# Patient Record
Sex: Male | Born: 1950 | Marital: Married | Smoking: Former smoker
Health system: Southern US, Community
[De-identification: ages and names within clinical notes are randomized; demographics above are authoritative.]

## PROBLEM LIST (undated history)

## (undated) DIAGNOSIS — J449 Chronic obstructive pulmonary disease, unspecified: Secondary | ICD-10-CM

## (undated) DIAGNOSIS — E119 Type 2 diabetes mellitus without complications: Secondary | ICD-10-CM

## (undated) DIAGNOSIS — E785 Hyperlipidemia, unspecified: Secondary | ICD-10-CM

## (undated) DIAGNOSIS — M1990 Unspecified osteoarthritis, unspecified site: Secondary | ICD-10-CM

## (undated) DIAGNOSIS — N200 Calculus of kidney: Secondary | ICD-10-CM

## (undated) DIAGNOSIS — K219 Gastro-esophageal reflux disease without esophagitis: Secondary | ICD-10-CM

## (undated) HISTORY — PX: COLOSTOMY: SHX63

## (undated) HISTORY — PX: NECK SURGERY: SHX720

## (undated) HISTORY — DX: Chronic obstructive pulmonary disease, unspecified: J44.9

## (undated) HISTORY — PX: ELBOW BURSA SURGERY: SHX615

## (undated) HISTORY — DX: Hyperlipidemia, unspecified: E78.5

## (undated) HISTORY — DX: Gastro-esophageal reflux disease without esophagitis: K21.9

## (undated) HISTORY — DX: Type 2 diabetes mellitus without complications: E11.9

## (undated) HISTORY — PX: APPENDECTOMY: SHX54

---

## 2015-04-13 ENCOUNTER — Encounter: Payer: Self-pay | Admitting: Gastroenterology

## 2015-05-03 ENCOUNTER — Encounter: Payer: Self-pay | Admitting: Gastroenterology

## 2015-05-03 ENCOUNTER — Other Ambulatory Visit: Payer: Self-pay

## 2015-05-03 ENCOUNTER — Ambulatory Visit (INDEPENDENT_AMBULATORY_CARE_PROVIDER_SITE_OTHER): Payer: Medicare HMO | Admitting: Gastroenterology

## 2015-05-03 VITALS — BP 115/74 | HR 89 | Temp 97.8°F | Ht 68.0 in | Wt 194.8 lb

## 2015-05-03 DIAGNOSIS — R110 Nausea: Secondary | ICD-10-CM

## 2015-05-03 DIAGNOSIS — R1013 Epigastric pain: Secondary | ICD-10-CM

## 2015-05-03 DIAGNOSIS — Z86010 Personal history of colonic polyps: Secondary | ICD-10-CM

## 2015-05-03 DIAGNOSIS — K635 Polyp of colon: Secondary | ICD-10-CM | POA: Diagnosis not present

## 2015-05-03 MED ORDER — NA SULFATE-K SULFATE-MG SULF 17.5-3.13-1.6 GM/177ML PO SOLN: 1.0000 | ORAL | Status: DC

## 2015-05-03 NOTE — Progress Notes (Signed)
Subjective:    Patient ID: Samuel Bird, male    DOB: November 14, 1950, 65 y.o.   MRN: [id number]  Shade Flood, MD  HPI Heartburn FAIRLY WELL CONTROLLED ON PRILOSEC 40 MG ONCE A DAY FOR PAST YEARS. GET SLOTS OF "CHILLS". NAUSEA: OFF AND ON-2-3 TIMES A WEEK. LASTS FOR FEW MINS. BETTER WITH TIME. BMs: SOFT(1X/DAY IN THE AM). HAS BREATHING PROBLEMS. HAS COPD. USED TO SMOKE BUT QUIT 7 YRS. ORIGINALLY FROM [place]. FEELS BLOATED AND RARE ABDOMINAL PAIN. APPETITE: NOT THE BEST. WEIGHT: SAME   PT DENIES FEVER, CHILLS, HEMATOCHEZIA, HEMATEMESIS, vomiting, melena, diarrhea, CHEST PAIN, SHORTNESS OF BREATH,  CHANGE IN BOWEL IN HABITS, constipation, abdominal pain, problems swallowing, OR problems with sedation.  Past Medical History  Diagnosis Date  . Diabetes mellitus ([place])   . COPD (chronic obstructive pulmonary disease) ([place])   . Hyperlipidemia   . GERD (gastroesophageal reflux disease)     Past Surgical History  Procedure Laterality Date  . Colostomy    . Appendectomy    . Neck surgery      Allergies  Allergen Reactions  . Glipizide Nausea And Vomiting  . Naproxen Other (See Comments)    "messing with his kidneys"    Current Outpatient Prescriptions  Medication Sig Dispense Refill  . acetaminophen (TYLENOL) 500 MG tablet Take by mouth daily as needed.    Marland Kitchen albuterol (VENTOLIN HFA) 108 (90 Base) MCG/ACT inhaler Inhale 2 puffs into the lungs as needed.    . carisoprodol (SOMA) 350 MG tablet 350 mg daily as needed.    . cycloSPORINE (RESTASIS) 0.05 % ophthalmic emulsion Apply to eye 2 (two) times daily.    . fluticasone (FLONASE) 50 MCG/ACT nasal spray Place into the nose.    . gabapentin (NEURONTIN) 300 MG capsule 300 mg 2 (two) times daily.    . metFORMIN (GLUCOPHAGE) 1000 MG tablet 1,000 mg 2 (two) times daily.    Marland Kitchen omeprazole (PRILOSEC) 40 MG capsule Take 40 mg by mouth daily.    . simvastatin (ZOCOR) 40 MG tablet Take 40 mg by mouth daily.    . sitaGLIPtin (JANUVIA) 50 MG  tablet Take 50 mg by mouth daily.    . Tiotropium Bromide Monohydrate (SPIRIVA RESPIMAT) 2.5 MCG/ACT AERS Inhale into the lungs.     Family History  Problem Relation Age of Onset  . Colon cancer Neg Hx   . Colon polyps Neg Hx    Social History  Substance Use Topics  . Smoking status: Former Research scientist (life sciences)  . Smokeless tobacco: None  . Alcohol Use: None   Review of Systems PER HPI OTHERWISE ALL SYSTEMS ARE NEGATIVE.    Objective:   Physical Exam  Constitutional: He is oriented to person, place, and time. He appears well-developed and well-nourished. No distress.  HENT:  Head: Normocephalic and atraumatic.  Mouth/Throat: Oropharynx is clear and moist. No oropharyngeal exudate.  Eyes: Pupils are equal, round, and reactive to light. No scleral icterus.  Neck: Normal range of motion. Neck supple.  Cardiovascular: Normal rate, regular rhythm and normal heart sounds.   Pulmonary/Chest: Effort normal and breath sounds normal. No respiratory distress.  Abdominal: Soft. Bowel sounds are normal. He exhibits no distension. There is no tenderness.  Musculoskeletal: He exhibits no edema.  Lymphadenopathy:    He has no cervical adenopathy.  Neurological: He is alert and oriented to person, place, and time.  NO FOCAL DEFICITS  Psychiatric: He has a normal mood and affect.  Vitals reviewed.  Assessment & Plan:

## 2015-05-03 NOTE — Progress Notes (Signed)
cc'ed to pcp °

## 2015-05-03 NOTE — Patient Instructions (Addendum)
COMPLETE ENDOSCOPY.  HOLD JANUVIA ON MORNING OF COLONOSCOPY.  CONTINUE GLUCOPHAGE ON MORNING OF COLONOSCOPY.

## 2015-05-03 NOTE — Assessment & Plan Note (Signed)
PERSONAL HISTORY O F POLYPS. LAST TCS 2009.  TCS WITHIN NEXT MO. DISCUSSED PROCEDURE, BENEFITS, & RISKS: < 1% chance of medication reaction, bleeding, perforation, or rupture of spleen/liver. OBTAIN LAST TCS/PATH REPORT FROM DR. PANDYA. HOLD JANUVIA ON AM OF TCS. CONTINUE GLUCOPHAGE

## 2015-05-03 NOTE — Assessment & Plan Note (Signed)
ASSOCIATED WITH NAUSEA & bloating. DIFFERENTIAL DIAGNOSIS INCLUDES: H PYLORI GASTRITIS, UNCONTROLLED GERD, LESS LIKELY GE JUNCTION TUMOR, GASTRIC OR PANCREATIC CA OR CHRONIC MESENTERIC ISCHEMIA.  EGD WITHIN  NEXT MO. DISCUSSED PROCEDURE, BENEFITS, & RISKS: < 1% chance of medication reaction, PERFORATION, OR bleeding. CONTINUE OMEPRAZOLE.  TAKE 30 MINUTES PRIOR TO YOUR FIRST MEAL. [facility] TBS SCHEDULED AFTER ENDOSCOPY.

## 2015-05-31 ENCOUNTER — Encounter (HOSPITAL_COMMUNITY): Payer: Self-pay | Admitting: *Deleted

## 2015-05-31 ENCOUNTER — Ambulatory Visit (HOSPITAL_COMMUNITY)
Admission: RE | Admit: 2015-05-31 | Discharge: 2015-05-31 | Disposition: A | Discharge status: Home / Self Care | Payer: Medicare HMO | Source: Ambulatory Visit | Attending: Gastroenterology | Admitting: Gastroenterology

## 2015-05-31 ENCOUNTER — Encounter (HOSPITAL_COMMUNITY)
Admission: RE | Disposition: A | Discharge status: Home / Self Care | Payer: Self-pay | Source: Ambulatory Visit | Attending: Gastroenterology

## 2015-05-31 DIAGNOSIS — K644 Residual hemorrhoidal skin tags: Secondary | ICD-10-CM | POA: Diagnosis not present

## 2015-05-31 DIAGNOSIS — R110 Nausea: Secondary | ICD-10-CM

## 2015-05-31 DIAGNOSIS — J449 Chronic obstructive pulmonary disease, unspecified: Secondary | ICD-10-CM | POA: Insufficient documentation

## 2015-05-31 DIAGNOSIS — E119 Type 2 diabetes mellitus without complications: Secondary | ICD-10-CM | POA: Diagnosis not present

## 2015-05-31 DIAGNOSIS — Z7984 Long term (current) use of oral hypoglycemic drugs: Secondary | ICD-10-CM | POA: Diagnosis not present

## 2015-05-31 DIAGNOSIS — K2950 Unspecified chronic gastritis without bleeding: Secondary | ICD-10-CM | POA: Insufficient documentation

## 2015-05-31 DIAGNOSIS — K5730 Diverticulosis of large intestine without perforation or abscess without bleeding: Secondary | ICD-10-CM | POA: Insufficient documentation

## 2015-05-31 DIAGNOSIS — K648 Other hemorrhoids: Secondary | ICD-10-CM | POA: Diagnosis not present

## 2015-05-31 DIAGNOSIS — Z79899 Other long term (current) drug therapy: Secondary | ICD-10-CM | POA: Insufficient documentation

## 2015-05-31 DIAGNOSIS — K2970 Gastritis, unspecified, without bleeding: Secondary | ICD-10-CM | POA: Diagnosis not present

## 2015-05-31 DIAGNOSIS — K635 Polyp of colon: Secondary | ICD-10-CM | POA: Diagnosis not present

## 2015-05-31 DIAGNOSIS — D122 Benign neoplasm of ascending colon: Secondary | ICD-10-CM | POA: Insufficient documentation

## 2015-05-31 DIAGNOSIS — E785 Hyperlipidemia, unspecified: Secondary | ICD-10-CM | POA: Diagnosis not present

## 2015-05-31 DIAGNOSIS — Z87891 Personal history of nicotine dependence: Secondary | ICD-10-CM | POA: Diagnosis not present

## 2015-05-31 DIAGNOSIS — Z860100 Personal history of colon polyps, unspecified: Secondary | ICD-10-CM

## 2015-05-31 DIAGNOSIS — K219 Gastro-esophageal reflux disease without esophagitis: Secondary | ICD-10-CM | POA: Insufficient documentation

## 2015-05-31 DIAGNOSIS — Z1211 Encounter for screening for malignant neoplasm of colon: Secondary | ICD-10-CM | POA: Diagnosis not present

## 2015-05-31 HISTORY — PX: ESOPHAGOGASTRODUODENOSCOPY: SHX5428

## 2015-05-31 HISTORY — PX: COLONOSCOPY: SHX5424

## 2015-05-31 LAB — GLUCOSE, CAPILLARY: Glucose-Capillary: 144 mg/dL — ABNORMAL HIGH (ref 65–99)

## 2015-05-31 SURGERY — COLONOSCOPY
Anesthesia: Moderate Sedation

## 2015-05-31 MED ORDER — MEPERIDINE HCL 100 MG/ML IJ SOLN
INTRAMUSCULAR | Status: AC
  Filled 2015-05-31: qty 2

## 2015-05-31 MED ORDER — LIDOCAINE VISCOUS 2 % MT SOLN
OROMUCOSAL | Status: DC | PRN
  Administered 2015-05-31: 5 mL via OROMUCOSAL

## 2015-05-31 MED ORDER — MEPERIDINE HCL 100 MG/ML IJ SOLN
INTRAMUSCULAR | Status: DC | PRN
  Administered 2015-05-31 (×4): 25 mg via INTRAVENOUS

## 2015-05-31 MED ORDER — LIDOCAINE VISCOUS 2 % MT SOLN
OROMUCOSAL | Status: AC
  Filled 2015-05-31: qty 15

## 2015-05-31 MED ORDER — MIDAZOLAM HCL 5 MG/5ML IJ SOLN
INTRAMUSCULAR | Status: DC | PRN
  Administered 2015-05-31 (×2): 2 mg via INTRAVENOUS
  Administered 2015-05-31: 1 mg via INTRAVENOUS
  Administered 2015-05-31: 2 mg via INTRAVENOUS

## 2015-05-31 MED ORDER — MIDAZOLAM HCL 5 MG/5ML IJ SOLN
INTRAMUSCULAR | Status: AC
  Filled 2015-05-31: qty 10

## 2015-05-31 MED ORDER — SODIUM CHLORIDE 0.9 % IV SOLN
INTRAVENOUS | Status: DC
  Administered 2015-05-31: 08:00:00 via INTRAVENOUS

## 2015-05-31 MED ORDER — STERILE WATER FOR IRRIGATION IR SOLN
Status: DC | PRN
  Administered 2015-05-31: 2.5 mL

## 2015-05-31 NOTE — Interval H&P Note (Signed)
History and Physical Interval Note:  05/31/2015 8:23 AM  Samuel Bird  has presented today for surgery, with the diagnosis of HISTORY OF POLYPS/NAUSEA  The various methods of treatment have been discussed with the patient and family. After consideration of risks, benefits and other options for treatment, the patient has consented to  Procedure(s) with comments: COLONOSCOPY (N/A) - 830 ESOPHAGOGASTRODUODENOSCOPY (EGD) (N/A) as a surgical intervention .  The patient's history has been reviewed, patient examined, no change in status, stable for surgery.  I have reviewed the patient's chart and labs.  Questions were answered to the patient's satisfaction.     [employer]

## 2015-05-31 NOTE — Discharge Instructions (Signed)
You had 1 polyp removed. YOU HAVE DIVERTICULOSIS IN YOUR LEFT and right COLON . YOU HAVE Large INTERNAL  And external HEMORRHOIDS. YOU HAVE GASTRITIS. I BIOPSIED YOUR STOMACH.    CONTINUE YOUR WEIGHT LOSS EFFORTS.  DRINK WATER TO KEEP YOUR URINE LIGHT YELLOW.  FOLLOW A HIGH FIBER/LOW FAT DIET. AVOID ITEMS THAT CAUSE BLOATING. SEE INFO BELOW.  CONTINUE OMEPRAZOLE.  TAKE 30 MINUTES PRIOR TO YOUR FIRST MEAL.  YOUR BIOPSY RESULTS WILL BE AVAILABLE IN MY CHART MAR 20 AND MY OFFICE WILL CONTACT YOU IN 10-14 DAYS WITH YOUR RESULTS.   Follow up in [place] 2017.  Next colonoscopy in 3-5 years.    ENDOSCOPY Care After Read the instructions outlined below and refer to this sheet in the next week. These discharge instructions provide you with general information on caring for yourself after you leave the hospital. While your treatment has been planned according to the most current medical practices available, unavoidable complications occasionally occur. If you have any problems or questions after discharge, call DR. Aden Youngman, [phone].  ACTIVITY  You may resume your regular activity, but move at a slower pace for the next 24 hours.   Take frequent rest periods for the next 24 hours.   Walking will help get rid of the air and reduce the bloated feeling in your belly (abdomen).   No driving for 24 hours (because of the medicine (anesthesia) used during the test).   You may shower.   Do not sign any important legal documents or operate any machinery for 24 hours (because of the anesthesia used during the test).    NUTRITION  Drink plenty of fluids.   You may resume your normal diet as instructed by your doctor.   Begin with a light meal and progress to your normal diet. Heavy or fried foods are harder to digest and may make you feel sick to your stomach (nauseated).   Avoid alcoholic beverages for 24 hours or as instructed.    MEDICATIONS  You may resume your normal  medications.   WHAT YOU CAN EXPECT TODAY  Some feelings of bloating in the abdomen.   Passage of more gas than usual.   Spotting of blood in your stool or on the toilet paper  .  IF YOU HAD POLYPS REMOVED DURING THE ENDOSCOPY:  Eat a soft diet IF YOU HAVE NAUSEA, BLOATING, ABDOMINAL PAIN, OR VOMITING.    FINDING OUT THE RESULTS OF YOUR TEST Not all test results are available during your visit. DR. Oneida Alar WILL CALL YOU WITHIN 14 DAYS OF YOUR PROCEDUE WITH YOUR RESULTS. Do not assume everything is normal if you have not heard from DR. Kayle Correa, CALL HER OFFICE AT [phone].  SEEK IMMEDIATE MEDICAL ATTENTION AND CALL THE OFFICE: [phone] IF:  You have more than a spotting of blood in your stool.   Your belly is swollen (abdominal distention).   You are nauseated or vomiting.   You have a temperature over 101F.   You have abdominal pain or discomfort that is severe or gets worse throughout the day.   Low-Fat Diet BREADS, CEREALS, PASTA, RICE, DRIED PEAS, AND BEANS These products are high in carbohydrates and most are low in fat. Therefore, they can be increased in the diet as substitutes for fatty foods. They too, however, contain calories and should not be eaten in excess. Cereals can be eaten for snacks as well as for breakfast.   FRUITS AND VEGETABLES It is good to eat fruits and vegetables. Besides  being sources of fiber, both are rich in vitamins and some minerals. They help you get the daily allowances of these nutrients. Fruits and vegetables can be used for snacks and desserts.  MEATS Limit lean meat, chicken, [place], and fish to no more than 6 ounces per day. Beef, Pork, and Lamb Use lean cuts of beef, pork, and lamb. Lean cuts include:  Extra-lean ground beef.  Arm roast.  Sirloin tip.  Center-cut ham.  Round steak.  Loin chops.  Rump roast.  Tenderloin.  Trim all fat off the outside of meats before cooking. It is not necessary to severely decrease the  intake of red meat, but lean choices should be made. Lean meat is rich in protein and contains a highly absorbable form of iron. Premenopausal women, in particular, should avoid reducing lean red meat because this could increase the risk for low red blood cells (iron-deficiency anemia).  Chicken and [place] These are good sources of protein. The fat of poultry can be reduced by removing the skin and underlying fat layers before cooking. Chicken and [place] can be substituted for lean red meat in the diet. Poultry should not be fried or covered with high-fat sauces. Fish and Shellfish Fish is a good source of protein. Shellfish contain cholesterol, but they usually are low in saturated fatty acids. The preparation of fish is important. Like chicken and [place], they should not be fried or covered with high-fat sauces. EGGS Egg whites contain no fat or cholesterol. They can be eaten often. Try 1 to 2 egg whites instead of whole eggs in recipes or use egg substitutes that do not contain yolk. MILK AND DAIRY PRODUCTS Use skim or 1% milk instead of 2% or whole milk. Decrease whole milk, natural, and processed cheeses. Use nonfat or low-fat (2%) cottage cheese or low-fat cheeses made from vegetable oils. Choose nonfat or low-fat (1 to 2%) yogurt. Experiment with evaporated skim milk in recipes that call for heavy cream. Substitute low-fat yogurt or low-fat cottage cheese for sour cream in dips and salad dressings. Have at least 2 servings of low-fat dairy products, such as 2 glasses of skim (or 1%) milk each day to help get your daily calcium intake. FATS AND OILS Reduce the total intake of fats, especially saturated fat. Butterfat, lard, and beef fats are high in saturated fat and cholesterol. These should be avoided as much as possible. Vegetable fats do not contain cholesterol, but certain vegetable fats, such as coconut oil, palm oil, and palm kernel oil are very high in saturated fats. These should be  limited. These fats are often used in bakery goods, processed foods, popcorn, oils, and nondairy creamers. Vegetable shortenings and some peanut butters contain hydrogenated oils, which are also saturated fats. Read the labels on these foods and check for saturated vegetable oils. Unsaturated vegetable oils and fats do not raise blood cholesterol. However, they should be limited because they are fats and are high in calories. Total fat should still be limited to 30% of your daily caloric intake. Desirable liquid vegetable oils are corn oil, cottonseed oil, olive oil, canola oil, safflower oil, soybean oil, and sunflower oil. Peanut oil is not as good, but small amounts are acceptable. Buy a heart-healthy tub margarine that has no partially hydrogenated oils in the ingredients. Mayonnaise and salad dressings often are made from unsaturated fats, but they should also be limited because of their high calorie and fat content. Seeds, nuts, peanut butter, olives, and avocados are high in fat,  but the fat is mainly the unsaturated type. These foods should be limited mainly to avoid excess calories and fat. OTHER EATING TIPS Snacks  Most sweets should be limited as snacks. They tend to be rich in calories and fats, and their caloric content outweighs their nutritional value. Some good choices in snacks are graham crackers, melba toast, soda crackers, bagels (no egg), English muffins, fruits, and vegetables. These snacks are preferable to snack crackers, [place] fries, TORTILLA CHIPS, and POTATO chips. Popcorn should be air-popped or cooked in small amounts of liquid vegetable oil. Desserts Eat fruit, low-fat yogurt, and fruit ices instead of pastries, cake, and cookies. Sherbet, angel food cake, gelatin dessert, frozen low-fat yogurt, or other frozen products that do not contain saturated fat (pure fruit juice bars, frozen ice pops) are also acceptable.  COOKING METHODS Choose those methods that use little or no  fat. They include: Poaching.  Braising.  Steaming.  Grilling.  Baking.  Stir-frying.  Broiling.  Microwaving.  Foods can be cooked in a nonstick pan without added fat, or use a nonfat cooking spray in regular cookware. Limit fried foods and avoid frying in saturated fat. Add moisture to lean meats by using water, broth, cooking wines, and other nonfat or low-fat sauces along with the cooking methods mentioned above. Soups and stews should be chilled after cooking. The fat that forms on top after a few hours in the refrigerator should be skimmed off. When preparing meals, avoid using excess salt. Salt can contribute to raising blood pressure in some people.  EATING AWAY FROM HOME Order entres, potatoes, and vegetables without sauces or butter. When meat exceeds the size of a deck of cards (3 to 4 ounces), the rest can be taken home for another meal. Choose vegetable or fruit salads and ask for low-calorie salad dressings to be served on the side. Use dressings sparingly. Limit high-fat toppings, such as bacon, crumbled eggs, cheese, sunflower seeds, and olives. Ask for heart-healthy tub margarine instead of butter.  High-Fiber Diet A high-fiber diet changes your normal diet to include more whole grains, legumes, fruits, and vegetables. Changes in the diet involve replacing refined carbohydrates with unrefined foods. The calorie level of the diet is essentially unchanged. The Dietary Reference Intake (recommended amount) for adult males is 38 grams per day. For adult females, it is 25 grams per day. Pregnant and lactating women should consume 28 grams of fiber per day. Fiber is the intact part of a plant that is not broken down during digestion. Functional fiber is fiber that has been isolated from the plant to provide a beneficial effect in the body. PURPOSE  Increase stool bulk.   Ease and regulate bowel movements.   Lower cholesterol.  REDUCE RISK OF COLON CANCER  INDICATIONS THAT YOU  NEED MORE FIBER  Constipation and hemorrhoids.   Uncomplicated diverticulosis (intestine condition) and irritable bowel syndrome.   Weight management.   As a protective measure against hardening of the arteries (atherosclerosis), diabetes, and cancer.   GUIDELINES FOR INCREASING FIBER IN THE DIET  Start adding fiber to the diet slowly. A gradual increase of about 5 more grams (2 slices of whole-wheat bread, 2 servings of most fruits or vegetables, or 1 bowl of high-fiber cereal) per day is best. Too rapid an increase in fiber may result in constipation, flatulence, and bloating.   Drink enough water and fluids to keep your urine clear or pale yellow. Water, juice, or caffeine-free drinks are recommended. Not drinking enough fluid  may cause constipation.   Eat a variety of high-fiber foods rather than one type of fiber.   Try to increase your intake of fiber through using high-fiber foods rather than fiber pills or supplements that contain small amounts of fiber.   The goal is to change the types of food eaten. Do not supplement your present diet with high-fiber foods, but replace foods in your present diet.   INCLUDE A VARIETY OF FIBER SOURCES  Replace refined and processed grains with whole grains, canned fruits with fresh fruits, and incorporate other fiber sources. White rice, white breads, and most bakery goods contain little or no fiber.   Brown whole-grain rice, buckwheat oats, and many fruits and vegetables are all good sources of fiber. These include: broccoli, Brussels sprouts, cabbage, cauliflower, beets, sweet potatoes, white potatoes (skin on), carrots, tomatoes, eggplant, squash, berries, fresh fruits, and dried fruits.   Cereals appear to be the richest source of fiber. Cereal fiber is found in whole grains and bran. Bran is the fiber-rich outer coat of cereal grain, which is largely removed in refining. In whole-grain cereals, the bran remains. In breakfast cereals, the  largest amount of fiber is found in those with "bran" in their names. The fiber content is sometimes indicated on the label.   You may need to include additional fruits and vegetables each day.   In baking, for 1 cup white flour, you may use the following substitutions:   1 cup whole-wheat flour minus 2 tablespoons.   1/2 cup white flour plus 1/2 cup whole-wheat flour.    Polyps, Colon  A polyp is extra tissue that grows inside your body. Colon polyps grow in the large intestine. The large intestine, also called the colon, is part of your digestive system. It is a long, hollow tube at the end of your digestive tract where your body makes and stores stool. Most polyps are not dangerous. They are benign. This means they are not cancerous. But over time, some types of polyps can turn into cancer. Polyps that are smaller than a pea are usually not harmful. But larger polyps could someday become or may already be cancerous. To be safe, doctors remove all polyps and test them.   PREVENTION There is not one sure way to prevent polyps. You might be able to lower your risk of getting them if you:  Eat more fruits and vegetables and less fatty food.   Do not smoke.   Avoid alcohol.   Exercise every day.   Lose weight if you are overweight.   Eating more calcium and folate can also lower your risk of getting polyps. Some foods that are rich in calcium are milk, cheese, and broccoli. Some foods that are rich in folate are chickpeas, kidney beans, and spinach.   Gastritis  Gastritis is an inflammation (the body's way of reacting to injury and/or infection) of the stomach. It is often caused by viral or bacterial (germ) infections. It can also be caused BY ASPIRIN, BC/GOODY POWDER'S, (IBUPROFEN) MOTRIN, OR ALEVE (NAPROXEN), chemicals (including alcohol), SPICY FOODS, and medications. This illness may be associated with generalized malaise (feeling tired, not well), UPPER ABDOMINAL STOMACH cramps,  and fever. One common bacterial cause of gastritis is an organism known as H. Pylori. This can be treated with antibiotics.   Hemorrhoids Hemorrhoids are dilated (enlarged) veins around the rectum. Sometimes clots will form in the veins. This makes them swollen and painful. These are called thrombosed hemorrhoids. Causes of hemorrhoids include:  Constipation.   Straining to have a bowel movement.   HEAVY LIFTING  HOME CARE INSTRUCTIONS  Eat a well balanced diet and drink 6 to 8 glasses of water every day to avoid constipation. You may also use a bulk laxative.   Avoid straining to have bowel movements.   Keep anal area dry and clean.   Do not use a donut shaped pillow or sit on the toilet for long periods. This increases blood pooling and pain.   Move your bowels when your body has the urge; this will require less straining and will decrease pain and pressure.    Diverticulosis Diverticulosis is a common condition that develops when small pouches (diverticula) form in the wall of the colon. The risk of diverticulosis increases with age. It happens more often in people who eat a low-fiber diet. Most individuals with diverticulosis have no symptoms. Those individuals with symptoms usually experience belly (abdominal) pain, constipation, or loose stools (diarrhea).  HOME CARE INSTRUCTIONS Increase the amount of fiber in your diet as directed by your caregiver or dietician. This may reduce symptoms of diverticulosis.  Drink at least 6 to 8 glasses of water each day to prevent constipation.  Try not to strain when you have a bowel movement.  AVOIDING FOOD WITH SEED IS NOT NECESSARY.  FOODS HAVING HIGH FIBER CONTENT INCLUDE: Fruits. Apple, peach, pear, tangerine, raisins, prunes.  Vegetables. Brussels sprouts, asparagus, broccoli, cabbage, carrot, cauliflower, romaine lettuce, spinach, summer squash, tomato, winter squash, zucchini.  Starchy Vegetables. Baked beans, kidney beans, lima  beans, split peas, lentils, potatoes (with skin).  Grains. Whole wheat bread, brown rice, bran flake cereal, plain oatmeal, white rice, shredded wheat, bran muffins.   SEEK IMMEDIATE MEDICAL CARE IF: You develop increasing pain or severe bloating.  You have an oral temperature above 101F.  You develop vomiting or bowel movements that are bloody or black.

## 2015-05-31 NOTE — H&P (View-Only) (Signed)
Subjective:    Patient ID: Samuel Bird, male    DOB: [birth year], 65 y.o.   MRN: [id number]  Shade Flood, MD  HPI Heartburn FAIRLY WELL CONTROLLED ON PRILOSEC 40 MG ONCE A DAY FOR PAST YEARS. GET SLOTS OF "CHILLS". NAUSEA: OFF AND ON-2-3 TIMES A WEEK. LASTS FOR FEW MINS. BETTER WITH TIME. BMs: SOFT(1X/DAY IN THE AM). HAS BREATHING PROBLEMS. HAS COPD. USED TO SMOKE BUT QUIT 7 YRS. ORIGINALLY FROM [place]. FEELS BLOATED AND RARE ABDOMINAL PAIN. APPETITE: NOT THE BEST. WEIGHT: SAME   PT DENIES FEVER, CHILLS, HEMATOCHEZIA, HEMATEMESIS, vomiting, melena, diarrhea, CHEST PAIN, SHORTNESS OF BREATH,  CHANGE IN BOWEL IN HABITS, constipation, abdominal pain, problems swallowing, OR problems with sedation.  Past Medical History  Diagnosis Date  . Diabetes mellitus ([place])   . COPD (chronic obstructive pulmonary disease) ([place])   . Hyperlipidemia   . GERD (gastroesophageal reflux disease)     Past Surgical History  Procedure Laterality Date  . Colostomy    . Appendectomy    . Neck surgery      Allergies  Allergen Reactions  . Glipizide Nausea And Vomiting  . Naproxen Other (See Comments)    "messing with his kidneys"    Current Outpatient Prescriptions  Medication Sig Dispense Refill  . acetaminophen (TYLENOL) 500 MG tablet Take by mouth daily as needed.    Marland Kitchen albuterol (VENTOLIN HFA) 108 (90 Base) MCG/ACT inhaler Inhale 2 puffs into the lungs as needed.    . carisoprodol (SOMA) 350 MG tablet 350 mg daily as needed.    . cycloSPORINE (RESTASIS) 0.05 % ophthalmic emulsion Apply to eye 2 (two) times daily.    . fluticasone (FLONASE) 50 MCG/ACT nasal spray Place into the nose.    . gabapentin (NEURONTIN) 300 MG capsule 300 mg 2 (two) times daily.    . metFORMIN (GLUCOPHAGE) 1000 MG tablet 1,000 mg 2 (two) times daily.    Marland Kitchen omeprazole (PRILOSEC) 40 MG capsule Take 40 mg by mouth daily.    . simvastatin (ZOCOR) 40 MG tablet Take 40 mg by mouth daily.    . sitaGLIPtin (JANUVIA) 50 MG  tablet Take 50 mg by mouth daily.    . Tiotropium Bromide Monohydrate (SPIRIVA RESPIMAT) 2.5 MCG/ACT AERS Inhale into the lungs.     Family History  Problem Relation Age of Onset  . Colon cancer Neg Hx   . Colon polyps Neg Hx    Social History  Substance Use Topics  . Smoking status: Former Research scientist (life sciences)  . Smokeless tobacco: None  . Alcohol Use: None   Review of Systems PER HPI OTHERWISE ALL SYSTEMS ARE NEGATIVE.    Objective:   Physical Exam  Constitutional: He is oriented to person, place, and time. He appears well-developed and well-nourished. No distress.  HENT:  Head: Normocephalic and atraumatic.  Mouth/Throat: Oropharynx is clear and moist. No oropharyngeal exudate.  Eyes: Pupils are equal, round, and reactive to light. No scleral icterus.  Neck: Normal range of motion. Neck supple.  Cardiovascular: Normal rate, regular rhythm and normal heart sounds.   Pulmonary/Chest: Effort normal and breath sounds normal. No respiratory distress.  Abdominal: Soft. Bowel sounds are normal. He exhibits no distension. There is no tenderness.  Musculoskeletal: He exhibits no edema.  Lymphadenopathy:    He has no cervical adenopathy.  Neurological: He is alert and oriented to person, place, and time.  NO FOCAL DEFICITS  Psychiatric: He has a normal mood and affect.  Vitals reviewed.  Assessment & Plan:

## 2015-06-01 NOTE — Op Note (Signed)
[facility] Patient Name: Samuel Bird Procedure Date: 05/31/2015 9:06 AM MRN: [id number] Date of Birth: 02-05-1951 Attending MD: Barney Drain , MD CSN: [id number] Age: 65 Admit Type: Outpatient Procedure:                Upper GI endoscopy Indications:              Nausea Providers:                Barney Drain, MD, Lurline Del, RN, Zoila Shutter,                            Technologist Referring MD:             Holly Bodily, MD Medicines:                Midazolam 1 mg IV Complications:            No immediate complications. Estimated Blood Loss:     Estimated blood loss was minimal. Procedure:                Pre-Anesthesia Assessment:                           - Prior to the procedure, a History and Physical                            was performed, and patient medications and                            allergies were reviewed. The patient's tolerance of                            previous anesthesia was also reviewed. The risks                            and benefits of the procedure and the sedation                            options and risks were discussed with the patient.                            All questions were answered, and informed consent                            was obtained. Prior Anticoagulants: The patient has                            taken no previous anticoagulant or antiplatelet                            agents. ASA Grade Assessment: II - A patient with                            mild systemic disease. After reviewing the risks  and benefits, the patient was deemed in                            satisfactory condition to undergo the procedure.                           - Prior to the procedure, a History and Physical                            was performed, and patient medications and                            allergies were reviewed. The patient's tolerance of                            previous anesthesia was also reviewed.  The risks                            and benefits of the procedure and the sedation                            options and risks were discussed with the patient.                            All questions were answered, and informed consent                            was obtained. Prior Anticoagulants: The patient has                            taken no previous anticoagulant or antiplatelet                            agents. ASA Grade Assessment: II - A patient with                            mild systemic disease. After reviewing the risks                            and benefits, the patient was deemed in                            satisfactory condition to undergo the procedure.                           After obtaining informed consent, the endoscope was                            passed under direct vision. Throughout the                            procedure, the patient's blood pressure, pulse, and  oxygen saturations were monitored continuously. The                            [id number]) scope was introduced through the                            mouth, and advanced to the second part of duodenum.                            The upper GI endoscopy was accomplished without                            difficulty. The patient tolerated the procedure                            well. Scope In: 9:14:19 AM Scope Out: 9:19:05 AM Total Procedure Duration: 0 hours 4 minutes 46 seconds  Findings:      The examined esophagus was normal.      Scattered mild inflammation characterized by erythema was found in the       gastric antrum. Biopsies were taken with a cold forceps for Helicobacter       pylori testing.      The duodenal bulb and second portion of the duodenum were normal. Impression:               - Normal esophagus.                           - Gastritis. Biopsied.                           - Normal duodenal bulb and second portion of the                             duodenum. Moderate Sedation:      Moderate (conscious) sedation was administered by the endoscopy nurse       and supervised by the endoscopist. The following parameters were       monitored: oxygen saturation, heart rate, blood pressure, and response       to care. Total physician intraservice time was 42 minutes. Recommendation:           - Patient has a contact number available for                            emergencies. The signs and symptoms of potential                            delayed complications were discussed with the                            patient. Return to normal activities tomorrow.                            Written discharge instructions were provided to the  patient.                           - High fiber diet and low fat diet.                           - Continue present medications.                           - Await pathology results.                           - Return to GI office in 4 months.                           CONTINUE YOUR WEIGHT LOSS EFFORTS.                           DRINK WATER TO KEEP YOUR URINE LIGHT YELLOW.                           CONTINUE OMEPRAZOLE. TAKE 30 MINUTES PRIOR TO YOUR                            FIRST MEAL.                           Follow up in [place] 2017.                           Next colonoscopy in 3-5 years. Procedure Code(s):        --- Professional ---                           [id number], Esophagogastroduodenoscopy, flexible,                            transoral; with biopsy, single or multiple                           99153, Moderate sedation services; each additional                            15 minutes intraservice time                           99153, Moderate sedation services; each additional                            15 minutes intraservice time                           G0500, Moderate sedation services provided by the                            same physician or other qualified  health care  professional performing a gastrointestinal                            endoscopic service that sedation supports,                            requiring the presence of an independent trained                            observer to assist in the monitoring of the                            patient's level of consciousness and physiological                            status; initial 15 minutes of intra-service time;                            patient age 33 years or older (additional time may                            be reported with [phone], as appropriate) Diagnosis Code(s):        --- Professional ---                           K29.70, Gastritis, unspecified, without bleeding                           R11.0, Nausea CPT copyright 2016 American Medical Association. All rights reserved. The codes documented in this report are preliminary and upon coder review may  be revised to meet current compliance requirements. Barney Drain, MD Barney Drain, MD 06/01/2015 8:53:47 AM This report has been signed electronically. Number of Addenda: 0

## 2015-06-01 NOTE — Op Note (Signed)
[facility] Patient Name: Samuel Bird Procedure Date: 05/31/2015 8:09 AM MRN: [id number] Date of Birth: 07/27/1950 Attending MD: Barney Drain , MD CSN: [id number] Age: 65 Admit Type: Outpatient Procedure:                Colonoscopy Indications:              Screening for colorectal malignant neoplasm Providers:                Barney Drain, MD, Lurline Del, RN, Zoila Shutter,                            Technologist Referring MD:             Holly Bodily, MD Medicines:                Meperidine 100 mg IV, Midazolam 6 mg IV Complications:            No immediate complications. Estimated Blood Loss:     Estimated blood loss was minimal. Procedure:                Pre-Anesthesia Assessment:                           - Prior to the procedure, a History and Physical                            was performed, and patient medications and                            allergies were reviewed. The patient's tolerance of                            previous anesthesia was also reviewed. The risks                            and benefits of the procedure and the sedation                            options and risks were discussed with the patient.                            All questions were answered, and informed consent                            was obtained. Prior Anticoagulants: The patient has                            taken no previous anticoagulant or antiplatelet                            agents. ASA Grade Assessment: II - A patient with                            mild systemic disease. After reviewing the risks  and benefits, the patient was deemed in                            satisfactory condition to undergo the procedure.                           After obtaining informed consent, the colonoscope                            was passed under direct vision. Throughout the                            procedure, the patient's blood pressure, pulse, and                  oxygen saturations were monitored continuously. The                            [id number] scope was introduced through                            the anus and advanced to the the cecum, identified                            by appendiceal orifice and ileocecal valve. The                            ileocecal valve, appendiceal orifice, and rectum                            were photographed. The colonoscopy was performed                            without difficulty. The patient tolerated the                            procedure well. The quality of the bowel                            preparation was good. Scope In: 8:51:12 AM Scope Out: 9:06:23 AM Scope Withdrawal Time: 0 hours 11 minutes 29 seconds  Total Procedure Duration: 0 hours 15 minutes 11 seconds  Findings:      A 8 mm polyp was found in the ascending colon. The polyp was sessile.       The polyp was removed with a hot snare. Resection and retrieval were       complete. Estimated blood loss was minimal.      Multiple small and large-mouthed diverticula were found in the sigmoid       colon, descending colon and ascending colon. There was narrowing of the       colon in association with the diverticular opening.      External and internal hemorrhoids were found. The hemorrhoids were large. Impression:               - One 8 mm polyp in the ascending colon, removed  with a hot snare. Resected and retrieved.                           - Moderate diverticulosis in the sigmoid colon, in                            the descending colon and in the ascending colon.                            There was narrowing of the colon in association                            with the diverticular opening.                           - External and internal hemorrhoids. Moderate Sedation:      Moderate (conscious) sedation was administered by the endoscopy nurse       and supervised by the  endoscopist. The following parameters were       monitored: oxygen saturation, heart rate, blood pressure, and response       to care. Total physician intraservice time was 42 minutes. Recommendation:           - Patient has a contact number available for                            emergencies. The signs and symptoms of potential                            delayed complications were discussed with the                            patient. Return to normal activities tomorrow.                            Written discharge instructions were provided to the                            patient.                           - High fiber diet and low fat diet.                           - Continue present medications.                           - Await pathology results.                           - Repeat colonoscopy in 3 - 5 years for                            surveillance.                           -  Return to GI office in 4 months.                           CONTINUE YOUR WEIGHT LOSS EFFORTS.                           DRINK WATER TO KEEP YOUR URINE LIGHT YELLOW.                           CONTINUE OMEPRAZOLE. TAKE 30 MINUTES PRIOR TO YOUR                            FIRST MEAL. Procedure Code(s):        --- Professional ---                           [phone], Colonoscopy, flexible; with removal of                            tumor(s), polyp(s), or other lesion(s) by snare                            technique                           99153, Moderate sedation services; each additional                            15 minutes intraservice time                           99153, Moderate sedation services; each additional                            15 minutes intraservice time                           G0500, Moderate sedation services provided by the                            same physician or other qualified health care                            professional performing a gastrointestinal                             endoscopic service that sedation supports,                            requiring the presence of an independent trained                            observer to assist in the monitoring of the                            patient's level of consciousness and physiological  status; initial 15 minutes of intra-service time;                            patient age 37 years or older (additional time may                            be reported with [phone], as appropriate) Diagnosis Code(s):        --- Professional ---                           Z12.11, Encounter for screening for malignant                            neoplasm of colon                           D12.2, Benign neoplasm of ascending colon                           K64.8, Other hemorrhoids                           K57.30, Diverticulosis of large intestine without                            perforation or abscess without bleeding CPT copyright 2016 American Medical Association. All rights reserved. The codes documented in this report are preliminary and upon coder review may  be revised to meet current compliance requirements. Barney Drain, MD Barney Drain, MD 06/01/2015 8:49:22 AM This report has been signed electronically. Number of Addenda: 0

## 2015-06-05 ENCOUNTER — Encounter (HOSPITAL_COMMUNITY): Payer: Self-pay | Admitting: Gastroenterology

## 2015-07-01 ENCOUNTER — Telehealth: Payer: Self-pay | Admitting: Gastroenterology

## 2015-07-01 ENCOUNTER — Encounter: Payer: Self-pay | Admitting: Gastroenterology

## 2015-07-01 NOTE — Telephone Encounter (Signed)
Please call pt. He had A simple adenoma removed. His stomach Bx shows gastritis. HIS BLOATING AND OCCASIONAL NAUSEA MAY BE DUE TO MEDS OR DIETARY CHOICES THAT CAUSE UNCONTROLLED REFLUX.  CONTINUE YOUR WEIGHT LOSS EFFORTS.  DRINK WATER TO KEEP YOUR URINE LIGHT YELLOW.  FOLLOW A HIGH FIBER/LOW FAT DIET. AVOID ITEMS THAT CAUSE BLOATING.   CONTINUE OMEPRAZOLE.  TAKE 30 MINUTES PRIOR TO YOUR FIRST MEAL.  Follow up in [place] 2017 E30 BLOATING/NAUSEA.  Next colonoscopy in 5-10 years.

## 2015-07-02 NOTE — Telephone Encounter (Signed)
ON RECALL  °

## 2015-07-02 NOTE — Telephone Encounter (Signed)
Pt called back and is aware of results.  

## 2015-07-02 NOTE — Telephone Encounter (Signed)
Called pt and LMOM to call office back  

## 2015-08-23 ENCOUNTER — Encounter: Payer: Self-pay | Admitting: Gastroenterology

## 2015-10-01 ENCOUNTER — Emergency Department (HOSPITAL_COMMUNITY): Payer: Medicare HMO

## 2015-10-01 ENCOUNTER — Encounter (HOSPITAL_COMMUNITY): Payer: Self-pay | Admitting: Emergency Medicine

## 2015-10-01 ENCOUNTER — Emergency Department (HOSPITAL_COMMUNITY)
Admission: EM | Admit: 2015-10-01 | Discharge: 2015-10-01 | Disposition: A | Discharge status: Home / Self Care | Payer: Medicare HMO | Attending: Emergency Medicine | Admitting: Emergency Medicine

## 2015-10-01 DIAGNOSIS — J449 Chronic obstructive pulmonary disease, unspecified: Secondary | ICD-10-CM | POA: Insufficient documentation

## 2015-10-01 DIAGNOSIS — E119 Type 2 diabetes mellitus without complications: Secondary | ICD-10-CM | POA: Insufficient documentation

## 2015-10-01 DIAGNOSIS — R1030 Lower abdominal pain, unspecified: Secondary | ICD-10-CM | POA: Diagnosis present

## 2015-10-01 DIAGNOSIS — Z87891 Personal history of nicotine dependence: Secondary | ICD-10-CM | POA: Diagnosis not present

## 2015-10-01 DIAGNOSIS — N201 Calculus of ureter: Secondary | ICD-10-CM

## 2015-10-01 DIAGNOSIS — R319 Hematuria, unspecified: Secondary | ICD-10-CM

## 2015-10-01 DIAGNOSIS — Z79899 Other long term (current) drug therapy: Secondary | ICD-10-CM | POA: Insufficient documentation

## 2015-10-01 DIAGNOSIS — Z7984 Long term (current) use of oral hypoglycemic drugs: Secondary | ICD-10-CM | POA: Insufficient documentation

## 2015-10-01 DIAGNOSIS — E785 Hyperlipidemia, unspecified: Secondary | ICD-10-CM | POA: Diagnosis not present

## 2015-10-01 HISTORY — DX: Calculus of kidney: N20.0

## 2015-10-01 LAB — CBC WITH DIFFERENTIAL/PLATELET
Basophils Absolute: 0 10*3/uL (ref 0.0–0.1)
Basophils Relative: 1 %
EOS ABS: 0.3 10*3/uL (ref 0.0–0.7)
Eosinophils Relative: 4 %
HEMATOCRIT: 39.8 % (ref 39.0–52.0)
HEMOGLOBIN: 13.2 g/dL (ref 13.0–17.0)
LYMPHS ABS: 1.7 10*3/uL (ref 0.7–4.0)
LYMPHS PCT: 26 %
MCH: 29.5 pg (ref 26.0–34.0)
MCHC: 33.2 g/dL (ref 30.0–36.0)
MCV: 89 fL (ref 78.0–100.0)
Monocytes Absolute: 0.7 10*3/uL (ref 0.1–1.0)
Monocytes Relative: 11 %
NEUTROS ABS: 3.7 10*3/uL (ref 1.7–7.7)
Neutrophils Relative %: 58 %
Platelets: 208 10*3/uL (ref 150–400)
RBC: 4.47 MIL/uL (ref 4.22–5.81)
RDW: 13.4 % (ref 11.5–15.5)
WBC: 6.4 10*3/uL (ref 4.0–10.5)

## 2015-10-01 LAB — URINE MICROSCOPIC-ADD ON

## 2015-10-01 LAB — URINALYSIS, ROUTINE W REFLEX MICROSCOPIC
BILIRUBIN URINE: NEGATIVE
GLUCOSE, UA: 500 mg/dL — AB
Ketones, ur: NEGATIVE mg/dL
Leukocytes, UA: NEGATIVE
Nitrite: NEGATIVE
PH: 6 (ref 5.0–8.0)
Protein, ur: NEGATIVE mg/dL
SPECIFIC GRAVITY, URINE: 1.025 (ref 1.005–1.030)

## 2015-10-01 LAB — BASIC METABOLIC PANEL
Anion gap: 3 — ABNORMAL LOW (ref 5–15)
BUN: 13 mg/dL (ref 6–20)
CHLORIDE: 106 mmol/L (ref 101–111)
CO2: 26 mmol/L (ref 22–32)
Calcium: 8.6 mg/dL — ABNORMAL LOW (ref 8.9–10.3)
Creatinine, Ser: 1.15 mg/dL (ref 0.61–1.24)
GFR calc Af Amer: >60 mL/min (ref >60)
GFR calc non Af Amer: >60 mL/min (ref >60)
Glucose, Bld: 165 mg/dL — ABNORMAL HIGH (ref 65–99)
POTASSIUM: 4.1 mmol/L (ref 3.5–5.1)
SODIUM: 135 mmol/L (ref 135–145)

## 2015-10-01 MED ORDER — TAMSULOSIN HCL 0.4 MG PO CAPS: 0.4000 mg | ORAL_CAPSULE | Freq: Every day | ORAL | Status: DC

## 2015-10-01 MED ORDER — HYDROCODONE-ACETAMINOPHEN 5-325 MG PO TABS: 1.0000 | ORAL_TABLET | ORAL | Status: DC | PRN

## 2015-10-01 MED ORDER — OXYCODONE-ACETAMINOPHEN 5-325 MG PO TABS
2.0000 | ORAL_TABLET | Freq: Once | ORAL | Status: AC
  Administered 2015-10-01: 2 via ORAL
  Filled 2015-10-01: qty 2

## 2015-10-01 MED ORDER — ONDANSETRON HCL 4 MG PO TABS: 4.0000 mg | ORAL_TABLET | Freq: Four times a day (QID) | ORAL | Status: DC

## 2015-10-01 MED ORDER — IBUPROFEN 800 MG PO TABS
800.0000 mg | ORAL_TABLET | Freq: Once | ORAL | Status: AC
  Administered 2015-10-01: 800 mg via ORAL
  Filled 2015-10-01: qty 1

## 2015-10-01 MED ORDER — IBUPROFEN 400 MG PO TABS: 400.0000 mg | ORAL_TABLET | Freq: Three times a day (TID) | ORAL | Status: DC

## 2015-10-01 NOTE — ED Notes (Signed)
From CT 

## 2015-10-01 NOTE — ED Notes (Signed)
DCd with teachback- med education regarding cautions regarding pain meds, follow up with urology as well as increasing fluids, return for and my chart. Ambulated to exit with his spouse.

## 2015-10-01 NOTE — ED Notes (Addendum)
Patient complaining of lower abdominal pain x 1 month. States "I went to [place] about a month ago for a kidney stone and I took all my medicine but the pain has went from my side to my stomach and won't go away. I don't think I ever passed the stone."

## 2015-10-01 NOTE — ED Notes (Signed)
Bladder scan results = 154mL. PT c/o tenderness with palpation to lower middle abdomen.

## 2015-10-01 NOTE — Discharge Instructions (Signed)
Kidney Stones Follow up with the urologist. Take the medication as prescribed. Return to the ED if you develop new or worsening symptoms. Kidney stones (urolithiasis) are deposits that form inside your kidneys. The intense pain is caused by the stone moving through the urinary tract. When the stone moves, the ureter goes into spasm around the stone. The stone is usually passed in the urine.  CAUSES   A disorder that makes certain neck glands produce too much parathyroid hormone (primary hyperparathyroidism).  A buildup of uric acid crystals, similar to gout in your joints.  Narrowing (stricture) of the ureter.  A kidney obstruction present at birth (congenital obstruction).  Previous surgery on the kidney or ureters.  Numerous kidney infections. SYMPTOMS   Feeling sick to your stomach (nauseous).  Throwing up (vomiting).  Blood in the urine (hematuria).  Pain that usually spreads (radiates) to the groin.  Frequency or urgency of urination. DIAGNOSIS   Taking a history and physical exam.  Blood or urine tests.  CT scan.  Occasionally, an examination of the inside of the urinary bladder (cystoscopy) is performed. TREATMENT   Observation.  Increasing your fluid intake.  Extracorporeal shock wave lithotripsy--This is a noninvasive procedure that uses shock waves to break up kidney stones.  Surgery may be needed if you have severe pain or persistent obstruction. There are various surgical procedures. Most of the procedures are performed with the use of small instruments. Only small incisions are needed to accommodate these instruments, so recovery time is minimized. The size, location, and chemical composition are all important variables that will determine the proper choice of action for you. Talk to your health care provider to better understand your situation so that you will minimize the risk of injury to yourself and your kidney.  HOME CARE INSTRUCTIONS   Drink enough  water and fluids to keep your urine clear or pale yellow. This will help you to pass the stone or stone fragments.  Strain all urine through the provided strainer. Keep all particulate matter and stones for your health care provider to see. The stone causing the pain may be as small as a grain of salt. It is very important to use the strainer each and every time you pass your urine. The collection of your stone will allow your health care provider to analyze it and verify that a stone has actually passed. The stone analysis will often identify what you can do to reduce the incidence of recurrences.  Only take over-the-counter or prescription medicines for pain, discomfort, or fever as directed by your health care provider.  Keep all follow-up visits as told by your health care provider. This is important.  Get follow-up X-rays if required. The absence of pain does not always mean that the stone has passed. It may have only stopped moving. If the urine remains completely obstructed, it can cause loss of kidney function or even complete destruction of the kidney. It is your responsibility to make sure X-rays and follow-ups are completed. Ultrasounds of the kidney can show blockages and the status of the kidney. Ultrasounds are not associated with any radiation and can be performed easily in a matter of minutes.  Make changes to your daily diet as told by your health care provider. You may be told to:  Limit the amount of salt that you eat.  Eat 5 or more servings of fruits and vegetables each day.  Limit the amount of meat, poultry, fish, and eggs that you eat.  Collect a 24-hour urine sample as told by your health care provider.You may need to collect another urine sample every 6-12 months. SEEK MEDICAL CARE IF:  You experience pain that is progressive and unresponsive to any pain medicine you have been prescribed. SEEK IMMEDIATE MEDICAL CARE IF:   Pain cannot be controlled with the  prescribed medicine.  You have a fever or shaking chills.  The severity or intensity of pain increases over 18 hours and is not relieved by pain medicine.  You develop a new onset of abdominal pain.  You feel faint or pass out.  You are unable to urinate.   This information is not intended to replace advice given to you by your health care provider. Make sure you discuss any questions you have with your health care provider.   Document Released: 03/03/2005 Document Revised: 11/22/2014 Document Reviewed: 08/04/2012 Elsevier Interactive Patient Education [employer].

## 2015-10-01 NOTE — ED Notes (Signed)
Call to CT pt is next to be scanned

## 2015-10-01 NOTE — ED Provider Notes (Signed)
CSN: [id number]     Arrival date & time 10/01/15  1450 History   First MD Initiated Contact with Patient 10/01/15 1739     Chief Complaint  Patient presents with  . Abdominal Pain     (Consider location/radiation/quality/duration/timing/severity/associated sxs/prior Treatment) HPI Comments: Patient presents with lower abdominal pain ongoing for the past 1 month. States she was diagnosed with kidney stones at the outside emergency Department one month ago when he had low back pain radiating to his stomach. States the pain never went away he is not certain that he passed a stone or not. The pain has moved from his back to his lower stomach. Denies any dysuria or hematuria. Denies any vomiting or fever. He was taking Percocet for the pain but he is now out. Denies any change in bowel or bladder habits. Good appetite. No chest pain or shortness of breath. No testicular pain. No history of prostate problems.  Patient is a 65 y.o. male presenting with abdominal pain. The history is provided by the patient and a relative.  Abdominal Pain Associated symptoms: no cough, no diarrhea, no dysuria, no fatigue, no fever, no hematuria, no nausea, no shortness of breath and no vomiting     Past Medical History  Diagnosis Date  . Diabetes mellitus ([place])   . COPD (chronic obstructive pulmonary disease) ([place])   . Hyperlipidemia   . GERD (gastroesophageal reflux disease)   . Kidney stone    Past Surgical History  Procedure Laterality Date  . Colostomy    . Appendectomy    . Neck surgery    . Colonoscopy N/A 05/31/2015    Procedure: COLONOSCOPY;  Surgeon: Danie Binder, MD;  [facility];  Service: Endoscopy;  Laterality: N/A;  830  . Esophagogastroduodenoscopy N/A 05/31/2015    Procedure: ESOPHAGOGASTRODUODENOSCOPY (EGD);  Surgeon: Danie Binder, MD;  [facility];  Service: Endoscopy;  Laterality: N/A;   Family History  Problem Relation Age of Onset  . Colon cancer Neg Hx   .  Colon polyps Neg Hx    Social History  Substance Use Topics  . Smoking status: Former Smoker -- 0.50 packs/day for 43 years    Quit date: 05/30/2008  . Smokeless tobacco: None  . Alcohol Use: No    Review of Systems  Constitutional: Negative for fever, activity change, appetite change and fatigue.  HENT: Negative for congestion and nosebleeds.   Respiratory: Negative for cough, chest tightness and shortness of breath.   Gastrointestinal: Positive for abdominal pain. Negative for nausea, vomiting and diarrhea.  Genitourinary: Positive for difficulty urinating. Negative for dysuria, urgency, hematuria, flank pain and testicular pain.  Musculoskeletal: Negative for myalgias and arthralgias.  Skin: Negative for rash.  Neurological: Negative for dizziness, weakness and headaches.   A complete 10 system review of systems was obtained and all systems are negative except as noted in the HPI and PMH.     Allergies  Glipizide and Naproxen  Home Medications   Prior to Admission medications   Medication Sig Start Date End Date Taking? Authorizing Provider  acetaminophen (TYLENOL) 500 MG tablet Take by mouth daily as needed.    Historical Provider, MD  albuterol (VENTOLIN HFA) 108 (90 Base) MCG/ACT inhaler Inhale 2 puffs into the lungs as needed.    Historical Provider, MD  carisoprodol (SOMA) 350 MG tablet 350 mg daily as needed. 03/31/15   Historical Provider, MD  cycloSPORINE (RESTASIS) 0.05 % ophthalmic emulsion Apply to eye 2 (two) times  daily. 10/14/14   Historical Provider, MD  fluticasone (FLONASE) 50 MCG/ACT nasal spray Place into the nose. 09/12/14   Historical Provider, MD  gabapentin (NEURONTIN) 300 MG capsule 300 mg 2 (two) times daily. 03/30/15   Historical Provider, MD  metFORMIN (GLUCOPHAGE) 1000 MG tablet 1,000 mg 2 (two) times daily. 03/30/15   Historical Provider, MD  omeprazole (PRILOSEC) 40 MG capsule Take 40 mg by mouth daily. 09/25/14   Historical Provider, MD  simvastatin  (ZOCOR) 40 MG tablet Take 40 mg by mouth daily.    Historical Provider, MD  sitaGLIPtin (JANUVIA) 50 MG tablet Take 50 mg by mouth daily. 10/30/14   Historical Provider, MD  Tiotropium Bromide Monohydrate (SPIRIVA RESPIMAT) 2.5 MCG/ACT AERS Inhale into the lungs. 11/24/13   Historical Provider, MD   BP 124/68 mmHg  Pulse 86  Temp(Src) 97.7 F (36.5 C) (Oral)  Resp 16  Ht 5\' 9"  (1.753 m)  Wt 195 lb (88.451 kg)  BMI 28.78 kg/m2  SpO2 96% Physical Exam  Constitutional: He is oriented to person, place, and time. He appears well-developed and well-nourished. No distress.  HENT:  Head: Normocephalic and atraumatic.  Mouth/Throat: Oropharynx is clear and moist. No oropharyngeal exudate.  Eyes: Conjunctivae and EOM are normal. Pupils are equal, round, and reactive to light.  Neck: Normal range of motion. Neck supple.  No meningismus.  Cardiovascular: Normal rate, regular rhythm, normal heart sounds and intact distal pulses.   No murmur heard. Pulmonary/Chest: Effort normal and breath sounds normal. No respiratory distress.  Abdominal: Soft. There is tenderness. There is no rebound and no guarding.  Suprapubic tenderness, no right lower quadrant tenderness  Genitourinary:  Testicles nontender. No gross bloody discharge. Prostate is nontender.  Musculoskeletal: Normal range of motion. He exhibits no edema or tenderness.  Neurological: He is alert and oriented to person, place, and time. No cranial nerve deficit. He exhibits normal muscle tone. Coordination normal.  No ataxia on finger to nose bilaterally. No pronator drift. 5/5 strength throughout. CN 2-12 intact.Equal grip strength. Sensation intact.   Skin: Skin is warm.  Psychiatric: He has a normal mood and affect. His behavior is normal.  Nursing note and vitals reviewed.   ED Course  Procedures (including critical care time) Labs Review Labs Reviewed  URINALYSIS, ROUTINE W REFLEX MICROSCOPIC (NOT AT [facility]) - Abnormal; Notable for the  following:    Glucose, UA 500 (*)    Hgb urine dipstick LARGE (*)    All other components within normal limits  URINE MICROSCOPIC-ADD ON - Abnormal; Notable for the following:    Squamous Epithelial / LPF 0-5 (*)    Bacteria, UA FEW (*)    All other components within normal limits  BASIC METABOLIC PANEL - Abnormal; Notable for the following:    Glucose, Bld 165 (*)    Calcium 8.6 (*)    Anion gap 3 (*)    All other components within normal limits  CBC WITH DIFFERENTIAL/PLATELET    Imaging Review Ct Renal Stone Study  10/01/2015  CLINICAL DATA:  Hematuria.  Lower abdominal pain for 1 month. EXAM: CT ABDOMEN AND PELVIS WITHOUT CONTRAST TECHNIQUE: Multidetector CT imaging of the abdomen and pelvis was performed following the standard protocol without IV contrast. COMPARISON:  None. FINDINGS: Lower chest:  The included lung bases are clear. Liver: No focal abnormality allowing for lack contrast. Hepatobiliary: Gallbladder physiologically distended, no calcified stone. No biliary dilatation. Pancreas: No ductal dilatation or inflammation. Spleen: Normal. Adrenal glands: No nodule. Kidneys: There  is a 6 x 8 mm stone in the distal left ureter with mild proximal hydroureteronephrosis. Trace left perinephric stranding. There are punctate nonobstructing stones in the lower and mid left kidney, with question of additional tiny intrarenal calculi. No right hydronephrosis or hydroureter. There is a 4 mm stone in the upper right kidney. Probable cyst in the lower right kidney is incompletely characterized without contrast. Stomach/Bowel: Stomach physiologically distended. There are no dilated or thickened small bowel loops. Moderate volume of stool throughout the colon without colonic wall thickening. The appendix is not visualized, patient is post appendectomy per report. Vascular/Lymphatic: No retroperitoneal adenopathy. Abdominal aorta is normal in caliber. Atherosclerosis of the abdominal aorta without  aneurysm. Reproductive: No acute abnormality. Prominent prostate gland measuring 5.4 cm. Bladder: Physiologically distended, no bladder stone. No perivesicular inflammation. Other: No free air, free fluid, or intra-abdominal fluid collection. Musculoskeletal: There are no acute or suspicious osseous abnormalities. Scattered bone islands in the pelvis. IMPRESSION: 1. Partially obstructing 6 x 8 mm stone in the distal left ureter with mild hydroureteronephrosis and perinephric edema. 2. Additional nonobstructing stones in both kidneys. Electronically Signed   By: Jeb Levering M.D.   On: 10/01/2015 19:42   I have personally reviewed and evaluated these images and lab results as part of my medical decision-making.   EKG Interpretation None      MDM   Final diagnoses:  Hematuria  Ureteral stone   Ongoing lower abdominal Pain for the past month after being diagnosed with kidney stones. Well-appearing. No vomiting or fever. No testicular pain.  Urinalysis with blood without infection.  CT shows 6 mm obstructing kidney stone with hydronephrosis. No evidence of urinary tract infection.  Discussed with Dr. Jeffie Pollock of urology who agrees with outpatient follow-up. There is no evidence of fever or leukocytosis. Patient's pain is well-controlled.  Patient has not seen urologist since his last ED visit last month.  Pain controlled in the ED. Appears stable for outpatient urology followup. Return precautions discussed.    Ezequiel Essex, MD 10/01/15 [phone]

## 2018-05-12 ENCOUNTER — Encounter: Payer: Self-pay | Admitting: Gastroenterology

## 2018-07-07 ENCOUNTER — Encounter: Payer: Self-pay | Admitting: *Deleted

## 2018-07-20 ENCOUNTER — Ambulatory Visit: Payer: Self-pay | Admitting: Gastroenterology

## 2018-07-21 ENCOUNTER — Ambulatory Visit: Payer: Medicare HMO | Admitting: Gastroenterology

## 2018-07-28 ENCOUNTER — Ambulatory Visit: Payer: Medicare HMO | Admitting: Gastroenterology

## 2018-08-19 ENCOUNTER — Ambulatory Visit: Payer: Medicare Other | Admitting: Gastroenterology

## 2018-08-19 ENCOUNTER — Encounter: Payer: Self-pay | Admitting: Gastroenterology

## 2018-08-19 ENCOUNTER — Other Ambulatory Visit: Payer: Self-pay

## 2018-08-19 DIAGNOSIS — R1013 Epigastric pain: Secondary | ICD-10-CM | POA: Diagnosis not present

## 2018-08-19 DIAGNOSIS — Z1159 Encounter for screening for other viral diseases: Secondary | ICD-10-CM | POA: Diagnosis not present

## 2018-08-19 MED ORDER — LACTASE 3000 UNITS PO TABS: 6000.0000 [IU] | ORAL_TABLET | Freq: Three times a day (TID) | ORAL | 11 refills | Status: DC

## 2018-08-19 NOTE — Progress Notes (Signed)
ON RECALL  °

## 2018-08-19 NOTE — Progress Notes (Signed)
Subjective:    Patient ID: Samuel Bird, male    DOB: 09-10-1950, 68 y.o.   MRN: [id number]  Petra Kuba, MD  HPI BLOATING ALL THE TIME FOR A LONG TIME. GETS UP IN AM AND EATS BREAKFAST AND IT STARTS AGAIN. MAY BE WORSE OVER LAST 6 MOS. MILK: RAEE, CHEESE: EVERY DAY, ICE CREAM: NOT REALLY. BMs: EVERY DAY, SATISFACTORY MOVEMENT. MAY HAVE SOME RECTAL URGENCY. DOESN'T DRINK MUCH WATER. NO SODA, MOSTLY COFFEE. NO SWEET TEA OR FRUIT JUICE. NAUSEA; RARE-1-2X/MO. MILD ABDOMINAL PAIN: MIDDLE, SHARP AND MAY DROP TO KNEES(1X Q2-3 MOS). RARE HEARTBURN: 1-2X/MO. FEELS LIKE HE EMPTIES HIS BLADDER MOST OF THE TIME.  PT DENIES FEVER, CHILLS, HEMATOCHEZIA, HEMATEMESIS, vomiting, melena, diarrhea, CHEST PAIN, SHORTNESS OF BREATH, CHANGE IN BOWEL IN HABITS, constipation, HEMATURIA, OR problems swallowing.  Past Medical History:  Diagnosis Date  . COPD (chronic obstructive pulmonary disease) ([place])   . Diabetes mellitus ([place])   . GERD (gastroesophageal reflux disease)   . Hyperlipidemia   . Kidney stone    Past Surgical History:  Procedure Laterality Date  . APPENDECTOMY    . COLONOSCOPY N/A 05/31/2015   Procedure: COLONOSCOPY;  Surgeon: Danie Binder, MD;  [facility];  Service: Endoscopy;  Laterality: N/A;  830  . COLOSTOMY    . ESOPHAGOGASTRODUODENOSCOPY N/A 05/31/2015   Procedure: ESOPHAGOGASTRODUODENOSCOPY (EGD);  Surgeon: Danie Binder, MD;  [facility];  Service: Endoscopy;  Laterality: N/A;  . NECK SURGERY     Allergies  Allergen Reactions  . Glipizide Nausea And Vomiting  . Naproxen Other (See Comments)    "messing with his kidneys"   Current Outpatient Medications  Medication Sig    . acetaminophen (TYLENOL) 500 MG tablet Take by mouth daily as needed.    Marland Kitchen aspirin EC 81 MG tablet Take 81 mg by mouth daily.    . fluticasone (FLONASE) 50 MCG/ACT nasal spray Place into the nose.    . gabapentin (NEURONTIN) 300 MG capsule 300 mg 2 (two) times daily.    .  metFORMIN (GLUCOPHAGE) 1000 MG tablet 1,000 mg 2 (two) times daily.    Marland Kitchen omeprazole (PRILOSEC) 40 MG capsule Take 40 mg by mouth daily.    . simvastatin (ZOCOR) 40 MG tablet Take 40 mg by mouth daily.    . sitaGLIPtin (JANUVIA) 50 MG tablet Take 50 mg by mouth daily.    . SYMBICORT 80-4.5 MCG/ACT inhaler Inhale 2 puffs into the lungs 2 (two) times a day.    . Tiotropium Bromide Monohydrate (SPIRIVA RESPIMAT) 2.5 MCG/ACT AERS Inhale into the lungs.    Marland Kitchen albuterol (VENTOLIN HFA) 108 (90 Base) MCG/ACT inhaler Inhale 2 puffs into the lungs as needed.    . carisoprodol (SOMA) 350 MG tablet 350 mg daily as needed.    . cycloSPORINE (RESTASIS) 0.05 % ophthalmic emulsion Apply to eye 2 (two) times daily.    .      .      .      .       Review of Systems PER HPI OTHERWISE ALL SYSTEMS ARE NEGATIVE.    Objective:   Physical Exam Vitals signs reviewed.  Constitutional:      General: He is not in acute distress.    Appearance: He is well-developed.  HENT:     Head: Normocephalic and atraumatic.     Mouth/Throat:     Pharynx: No oropharyngeal exudate.  Eyes:     General: No scleral icterus.  Pupils: Pupils are equal, round, and reactive to light.  Neck:     Musculoskeletal: Normal range of motion and neck supple.  Cardiovascular:     Rate and Rhythm: Normal rate and regular rhythm.     Heart sounds: Normal heart sounds.  Pulmonary:     Effort: Pulmonary effort is normal. No respiratory distress.     Breath sounds: Normal breath sounds.  Abdominal:     General: Bowel sounds are normal. There is no distension.     Palpations: Abdomen is soft.     Tenderness: There is no abdominal tenderness.  Musculoskeletal:     Right lower leg: No edema.     Left lower leg: No edema.  Lymphadenopathy:     Cervical: No cervical adenopathy.  Skin:    General: Skin is warm and dry.  Neurological:     General: No focal deficit present.     Mental Status: He is alert and oriented to person, place,  and time.  Psychiatric:        Mood and Affect: Mood normal.     Comments: NORMAL AFFECT       Assessment & Plan:

## 2018-08-19 NOTE — Assessment & Plan Note (Signed)
CHECK HEP C Ab IN AUG OR SEP 2020.

## 2018-08-19 NOTE — Assessment & Plan Note (Signed)
Due to lactose intolerance. SYMPTOMS NOT IDEALLY CONTROLLED due to daily cheese consumption.  EXPLAINED LACTOSE INTOLERANCE. DRINK WATER TO KEEP YOUR URINE LIGHT YELLOW. AVOID ITEMS THAT CAUSE BLOATING & GAS.  HANDOUT GIVEN. IF YOU CONSUME DAIRY, ADD LACTASE 2-3 PILLS WITH MEALS UP TO THREE TIMES A DAY. CONSIDER CHAIR YOGA/YOU TUBE. PRACTICE YOGA 2-3 TIMES A WEEK. FOLLOW UP IN 4 MOS.

## 2018-08-19 NOTE — Progress Notes (Signed)
CC'D TO PCP °

## 2018-08-19 NOTE — Patient Instructions (Addendum)
DRINK WATER TO KEEP YOUR URINE LIGHT YELLOW.   AVOID ITEMS THAT CAUSE BLOATING & GAS. SEE INFO BELOW.  IF YOU CONSUME DAIRY, ADD LACTASE 2-3 PILLS WITH MEALS UP TO THREE TIMES A DAY.   CONSIDER CHAIR YOGA/YOU TUBE. PRACTICE YOGA 2-3 TIMES A WEEK.   FOLLOW UP IN 4 MOS.     BLOATING AND GAS PREVENTION  Although gas may be uncomfortable and embarrassing, it is not life-threatening. Understanding causes, ways to reduce symptoms, and treatment will help most people find some relief.  Points to remember . Everyone has gas in the digestive tract. Marland Kitchen People often believe normal passage of gas to be excessive. . Gas comes from two main sources: swallowed air and normal breakdown of certain foods by harmless bacteria naturally present in the large intestine. . Many foods with carbohydrates can cause gas. Fats and proteins cause little gas. . Foods that may cause gas include o beans  o vegetables, such as broccoli, cabbage, brussels sprouts, onions, artichokes, and asparagus  o fruits, such as pears, apples, and peaches  o whole grains, such as whole wheat and bran  o soft drinks and fruit drinks  o milk and milk products, such as cheese and ice cream, and packaged foods prepared with lactose, such as bread, cereal, and salad dressing  o foods containing sorbitol, such as dietetic foods and sugar free candies and gums . The most common symptoms of gas are belching, flatulence, bloating, and abdominal pain. However, some of these symptoms are often caused by an intestinal disorder, such as irritable bowel syndrome, rather than too much gas. . The most common ways to reduce the discomfort of gas are changing diet, taking nonprescription medicines, and reducing the amount of air swallowed. . Digestive enzymes, such as lactase supplements, actually help digest carbohydrates and may allow people to eat foods that normally cause gas.

## 2018-10-11 ENCOUNTER — Other Ambulatory Visit: Payer: Self-pay

## 2018-10-11 DIAGNOSIS — Z1159 Encounter for screening for other viral diseases: Secondary | ICD-10-CM

## 2018-12-15 ENCOUNTER — Encounter: Payer: Self-pay | Admitting: Gastroenterology

## 2019-01-27 ENCOUNTER — Encounter: Payer: Self-pay | Admitting: Gastroenterology

## 2019-02-23 ENCOUNTER — Other Ambulatory Visit: Payer: Self-pay

## 2019-02-23 ENCOUNTER — Ambulatory Visit (INDEPENDENT_AMBULATORY_CARE_PROVIDER_SITE_OTHER): Payer: Medicare Other | Admitting: Gastroenterology

## 2019-02-23 DIAGNOSIS — R1033 Periumbilical pain: Secondary | ICD-10-CM

## 2019-02-23 DIAGNOSIS — R1319 Other dysphagia: Secondary | ICD-10-CM

## 2019-02-23 DIAGNOSIS — R1310 Dysphagia, unspecified: Secondary | ICD-10-CM | POA: Insufficient documentation

## 2019-02-23 NOTE — Progress Notes (Signed)
Subjective:    Patient ID: Samuel Bird, male    DOB: September 08, 1950, 68 y.o.   MRN: [id number]  System, Pcp Not In Primary Care Physician:  System, Pcp Not In  Primary GI:  Barney Drain, MD   Patient Location: home   Provider Location: [place] office   Reason for Visit: ABDOMINAL PAIN   Persons present on the virtual encounter, with roles: patient, myself (provider), MARTINA BOOTH CMA (update meds/allergies)   Total time (minutes) spent on medical discussion:  23 MINUTES   Due to COVID-19, visit was VIA TELEPHONE VISIT DUE TO COVID 19. VISIT IS CONDUCTED VIRTUALLY DUE TO PT HAVING URI SYPTOMS.   Virtual Visit via TELEPHONE   I connected with Samuel Bird  and verified that I am speaking with the correct person using two identifiers.   I discussed the limitations, risks, security and privacy concerns of performing an evaluation and management service by telephone/video and the availability of in person appointments. I also discussed with the patient that there may be a patient responsible charge related to this service. The patient expressed understanding and agreed to proceed.  HPI 2 WEEKS HAD TERRIBLE PAIN IN UMBILICAL AREA AND PAIN LASTED 7-8 DAYS AND REALLY STRONG. VERY TENDER TO TOUCH. PAIN FELT LIKE A PRESSURE AND LIKE SOMEONE STUCK HIM WITH A ICE PICK. OCCASIONALLY HAS TROUBLE WITH HIS SWALLWOING ABOUT EVERY DAY. HEARTBURN IS CONTROLLED MOST OF THE TIME. JUST SOLIDS AND PILLS. STILL TAKING OMEPRAZOLE.   PT DENIES FEVER, CHILLS, HEMATOCHEZIA, HEMATEMESIS, nausea, vomiting, melena, diarrhea, CHEST PAIN, SHORTNESS OF BREATH,  CHANGE IN BOWEL IN HABITS, constipation, problems swallowing, problems with sedation, OR heartburn or indigestion.  Past Medical History:  Diagnosis Date  . COPD (chronic obstructive pulmonary disease) ([place])   . Diabetes mellitus ([place])   . GERD (gastroesophageal reflux disease)   . Hyperlipidemia   . Kidney stone     Past Surgical History:  Procedure  Laterality Date  . APPENDECTOMY    . COLONOSCOPY N/A 05/31/2015   Procedure: COLONOSCOPY;  Surgeon: Danie Binder, MD;  [facility];  Service: Endoscopy;  Laterality: N/A;  830  . COLOSTOMY    . ESOPHAGOGASTRODUODENOSCOPY N/A 05/31/2015   Procedure: ESOPHAGOGASTRODUODENOSCOPY (EGD);  Surgeon: Danie Binder, MD;  [facility];  Service: Endoscopy;  Laterality: N/A;  . NECK SURGERY     Allergies  Allergen Reactions  . Glipizide Nausea And Vomiting  . Naproxen Other (See Comments)    "messing with his kidneys"   Current Outpatient Medications  Medication Sig    . acetaminophen (TYLENOL) 500 MG tablet Take by mouth daily as needed.    Marland Kitchen aspirin EC 81 MG tablet Take 81 mg by mouth daily.    . carisoprodol (SOMA) 350 MG tablet 350 mg daily as needed.    . fluticasone (FLONASE) 50 MCG/ACT nasal spray Place into the nose.    . gabapentin (NEURONTIN) 300 MG capsule 300 mg 2 (two) times daily.    Marland Kitchen ibuprofen (ADVIL,MOTRIN) 400 MG tablet Take 1 tablet (400 mg total) by mouth 3 (three) times daily. RARE   . lactase (LACTASE ENZYME) 3000 units tablet Take 2 tablets (6,000 Units total) by mouth 3 (three) times daily with meals.    . metFORMIN (GLUCOPHAGE) 1000 MG tablet 1,000 mg 2 (two) times daily with a meal.     . omeprazole (PRILOSEC) 40 MG capsule Take 40 mg by mouth daily.    . simvastatin (ZOCOR) 40 MG tablet Take  40 mg by mouth daily.    . sitaGLIPtin (JANUVIA) 50 MG tablet Take 50 mg by mouth daily.    . SYMBICORT 80-4.5 MCG/ACT inhaler Inhale 2 puffs into the lungs 2 (two) times a day.    . Tiotropium Bromide Monohydrate (SPIRIVA RESPIMAT) 2.5 MCG/ACT AERS Inhale into the lungs.    Marland Kitchen albuterol (VENTOLIN HFA) 108 (90 Base) MCG/ACT inhaler Inhale 2 puffs into the lungs as needed.    . cycloSPORINE (RESTASIS) 0.05 % ophthalmic emulsion Apply to eye 2 (two) times daily.    .      .      .        Review of Systems PER HPI OTHERWISE ALL SYSTEMS ARE NEGATIVE.     Objective:    TELEPHONE VISIT DUE TO COVID 19, VISIT IS CONDUCTED VIRTUALLY AND WAS REQUESTED DUE TO PT HAVING URI SYMPTOMS.  PHYSICAL EXAM: BULGE IN THE SUPRAUMBILICAL REGION. NON-TENDER.     Assessment & Plan:

## 2019-02-23 NOTE — Patient Instructions (Signed)
I WILL REFER YOU TO SURGERY TO HAVE YOUR HERNIA FIXED.  WE WILL GET YOU SCHEDULED FOR AN UPPER ENDOSCOPY.  PLEASE CALL WITH QUESTIONS OR CONCERNS.  FOLLOW UP IN 6 MOS.

## 2019-02-23 NOTE — Assessment & Plan Note (Signed)
SOLID DYSPHAGIA DAILY. DIFFERENTIAL DIAGNOSIS INCLUDES: PEPTIC STRICTURE, PRIMARY ESOPHAGEAL MOTILITY DISORDER, LESS LIKLEY ESOPHAGEAL CANCER.  SCHEDULED FOR AN UPPER ENDOSCOPY WITH DILATION. HOLD JANUVIA ON DAY OF EGD. OK TO CONTINUE GLUCOPHAGE. PHENERGAN 12.5 MG IV IN PREOP. DISCUSSED PROCEDURE, BENEFITS, & RISKS.  PLEASE CALL WITH QUESTIONS OR CONCERNS.  FOLLOW UP IN 6 MOS.

## 2019-02-23 NOTE — Assessment & Plan Note (Signed)
SYMPTOMS FAIRLY WELL CONTROLLED.  REFER TO SURGERY TO HAVE YOUR HERNIA REPAIRED PLEASE CALL WITH QUESTIONS OR CONCERNS.  FOLLOW UP IN 6 MOS.

## 2019-02-24 ENCOUNTER — Telehealth: Payer: Self-pay

## 2019-02-24 NOTE — Telephone Encounter (Signed)
Tried to call pt to schedule EGD/DIL w/SLF, no answer, LMOVM for return call.

## 2019-02-24 NOTE — Progress Notes (Signed)
ON RECALL  °

## 2019-02-25 NOTE — Telephone Encounter (Signed)
Tried to call pt, no answer, LMOAM for return call. 

## 2019-02-25 NOTE — Telephone Encounter (Signed)
Pt returned call. Please call him @ [phone] (H)

## 2019-02-28 ENCOUNTER — Other Ambulatory Visit: Payer: Self-pay

## 2019-02-28 DIAGNOSIS — R1319 Other dysphagia: Secondary | ICD-10-CM

## 2019-02-28 DIAGNOSIS — R1310 Dysphagia, unspecified: Secondary | ICD-10-CM

## 2019-02-28 NOTE — Telephone Encounter (Signed)
Called pt, EGD/DIL w/SLF scheduled for 06/22/19 at 8:30am. COVID test 06/20/19 at 1:00pm. Orders entered. Appt letter mailed with procedure instructions.

## 2019-03-31 ENCOUNTER — Other Ambulatory Visit: Payer: Self-pay

## 2019-03-31 ENCOUNTER — Ambulatory Visit (INDEPENDENT_AMBULATORY_CARE_PROVIDER_SITE_OTHER): Payer: Medicare Other | Admitting: General Surgery

## 2019-03-31 ENCOUNTER — Encounter: Payer: Self-pay | Admitting: General Surgery

## 2019-03-31 VITALS — BP 133/75 | HR 84 | Temp 98.1°F | Resp 16 | Ht 69.0 in | Wt 206.0 lb

## 2019-03-31 DIAGNOSIS — K429 Umbilical hernia without obstruction or gangrene: Secondary | ICD-10-CM

## 2019-03-31 NOTE — Patient Instructions (Signed)
Ventral Hernia  A ventral hernia is a bulge of tissue from inside the abdomen that pushes through a weak area of the muscles that form the front wall of the abdomen. The tissues inside the abdomen are inside a sac (peritoneum). These tissues include the small intestine, large intestine, and the fatty tissue that covers the intestines (omentum). Sometimes, the bulge that forms a hernia contains intestines. Other hernias contain only fat. Ventral hernias do not go away without surgical treatment. There are several types of ventral hernias. You may have:  A hernia at an incision site from previous abdominal surgery (incisional hernia).  A hernia just above the belly button (epigastric hernia), or at the belly button (umbilical hernia). These types of hernias can develop from heavy lifting or straining.  A hernia that comes and goes (reducible hernia). It may be visible only when you lift or strain. This type of hernia can be pushed back into the abdomen (reduced).  A hernia that traps abdominal tissue inside the hernia (incarcerated hernia). This type of hernia does not reduce.  A hernia that cuts off blood flow to the tissues inside the hernia (strangulated hernia). The tissues can start to die if this happens. This is a very painful bulge that cannot be reduced. A strangulated hernia is a medical emergency. What are the causes? This condition is caused by abdominal tissue putting pressure on an area of weakness in the abdominal muscles. What increases the risk? The following factors may make you more likely to develop this condition:  Being male.  Being 60 or older.  Being overweight or obese.  Having had previous abdominal surgery, especially if there was an infection after surgery.  Having had an injury to the abdominal wall.  Having had several pregnancies.  Having a buildup of fluid inside the abdomen (ascites). What are the signs or symptoms? The only symptom of a ventral hernia  may be a painless bulge in the abdomen. A reducible hernia may be visible only when you strain, cough, or lift. Other symptoms may include:  Dull pain.  A feeling of pressure. Signs and symptoms of a strangulated hernia may include:  Increasing pain.  Nausea and vomiting.  Pain when pressing on the hernia.  The skin over the hernia turning red or purple.  Constipation.  Blood in the stool (feces). How is this diagnosed? This condition may be diagnosed based on:  Your symptoms.  Your medical history.  A physical exam. You may be asked to cough or strain while standing. These actions increase the pressure inside your abdomen and force the hernia through the opening in your muscles. Your health care provider may try to reduce the hernia by pressing on it.  Imaging studies, such as an ultrasound or CT scan. How is this treated? This condition is treated with surgery. If you have a strangulated hernia, surgery is done as soon as possible. If your hernia is small and not incarcerated, you may be asked to lose some weight before surgery. Follow these instructions at home:  Follow instructions from your health care provider about eating or drinking restrictions.  If you are overweight, your health care provider may recommend that you increase your activity level and eat a healthier diet.  Do not lift anything that is heavier than 10 lb (4.5 kg).  Return to your normal activities as told by your health care provider. Ask your health care provider what activities are safe for you. You may need to avoid activities   that increase pressure on your hernia.  Take over-the-counter and prescription medicines only as told by your health care provider.  Keep all follow-up visits as told by your health care provider. This is important. Contact a health care provider if:  Your hernia gets larger.  Your hernia becomes painful. Get help right away if:  Your hernia becomes increasingly  painful.  You have pain along with any of the following: ? Changes in skin color in the area of the hernia. ? Nausea. ? Vomiting. ? Fever. Summary  A ventral hernia is a bulge of tissue from inside the abdomen that pushes through a weak area of the muscles that form the front wall of the abdomen.  This condition is treated with surgery, which may be urgent depending on your hernia.  Do not lift anything that is heavier than 10 lb (4.5 kg), and follow activity instructions from your health care provider. This information is not intended to replace advice given to you by your health care provider. Make sure you discuss any questions you have with your health care provider. Document Revised: 04/15/2017 Document Reviewed: 09/22/2016 Elsevier Patient Education  2020 [employer]  

## 2019-04-01 NOTE — Progress Notes (Signed)
Samuel Bird; [id number]; April 13, 1950   HPI Patient is a 69 year old Hispanic male who was referred to my care by Waynard Reeds and Dr. Trinda Pascal for evaluation treatment of an umbilical hernia.  Patient states is been present for some time, but now is increasing in size and causing discomfort.  He is able to reduce it on his own when lying back.  Is made worse with straining.  He currently has a pain level of 1 out of 10. Past Medical History:  Diagnosis Date  . COPD (chronic obstructive pulmonary disease) ([place])   . Diabetes mellitus ([place])   . GERD (gastroesophageal reflux disease)   . Hyperlipidemia   . Kidney stone     Past Surgical History:  Procedure Laterality Date  . APPENDECTOMY    . COLONOSCOPY N/A 05/31/2015   Procedure: COLONOSCOPY;  Surgeon: Danie Binder, MD;  Location: [facility];  Service: Endoscopy;  Laterality: N/A;  830  . COLOSTOMY    . ESOPHAGOGASTRODUODENOSCOPY N/A 05/31/2015   Procedure: ESOPHAGOGASTRODUODENOSCOPY (EGD);  Surgeon: Danie Binder, MD;  Location: [facility];  Service: Endoscopy;  Laterality: N/A;  . NECK SURGERY      Family History  Problem Relation Age of Onset  . Colon cancer Neg Hx   . Colon polyps Neg Hx     Current Outpatient Medications on File Prior to Visit  Medication Sig Dispense Refill  . acetaminophen (TYLENOL) 500 MG tablet Take by mouth daily as needed.    Marland Kitchen albuterol (PROVENTIL) (2.5 MG/3ML) 0.083% nebulizer solution albuterol sulfate 2.5 mg/3 mL (0.083 %) solution for nebulization    . aspirin EC 81 MG tablet Take 81 mg by mouth daily.    . cycloSPORINE (RESTASIS) 0.05 % ophthalmic emulsion Apply to eye 2 (two) times daily.    . fluticasone (FLONASE) 50 MCG/ACT nasal spray Place into the nose.    . gabapentin (NEURONTIN) 300 MG capsule 300 mg 2 (two) times daily.    Marland Kitchen ibuprofen (ADVIL,MOTRIN) 400 MG tablet Take 1 tablet (400 mg total) by mouth 3 (three) times daily. 30 tablet 0  . lactase (LACTASE ENZYME) 3000 units tablet  Take 2 tablets (6,000 Units total) by mouth 3 (three) times daily with meals. 300 tablet 11  . metFORMIN (GLUCOPHAGE) 1000 MG tablet 500 mg 2 (two) times daily with a meal.     . omeprazole (PRILOSEC) 40 MG capsule Take 40 mg by mouth daily.    . simvastatin (ZOCOR) 40 MG tablet Take 40 mg by mouth daily.    . sitaGLIPtin (JANUVIA) 50 MG tablet Take 50 mg by mouth daily.    . SYMBICORT 80-4.5 MCG/ACT inhaler Inhale 2 puffs into the lungs 2 (two) times a day.    . tamsulosin (FLOMAX) 0.4 MG CAPS capsule Take 1 capsule (0.4 mg total) by mouth daily after supper. 10 capsule 0  . Tiotropium Bromide Monohydrate (SPIRIVA RESPIMAT) 2.5 MCG/ACT AERS Inhale into the lungs.    Marland Kitchen albuterol (VENTOLIN HFA) 108 (90 Base) MCG/ACT inhaler Inhale 2 puffs into the lungs as needed.    . carisoprodol (SOMA) 350 MG tablet 350 mg daily as needed.    Marland Kitchen HYDROcodone-acetaminophen (NORCO/VICODIN) 5-325 MG tablet Take 1 tablet by mouth every 4 (four) hours as needed. (Patient not taking: Reported on 08/19/2018) 10 tablet 0  . ondansetron (ZOFRAN) 4 MG tablet Take 1 tablet (4 mg total) by mouth every 6 (six) hours. (Patient not taking: Reported on 08/19/2018) 12 tablet 0   No current  facility-administered medications on file prior to visit.    Allergies  Allergen Reactions  . Fluticasone-Salmeterol   . Glipizide Nausea And Vomiting  . Naproxen Other (See Comments)    "messing with his kidneys"    Social History   Substance and Sexual Activity  Alcohol Use No  . Alcohol/week: 0.0 standard drinks    Social History   Tobacco Use  Smoking Status Former Smoker  . Packs/day: 0.50  . Years: 43.00  . Pack years: 21.50  . Quit date: 05/30/2008  . Years since quitting: 10.8  Smokeless Tobacco Never Used    Review of Systems  Constitutional: Negative.   HENT: Positive for sinus pain.   Eyes: Positive for blurred vision.  Respiratory: Positive for shortness of breath and wheezing.   Cardiovascular: Negative.    Gastrointestinal: Negative.   Genitourinary: Negative.   Musculoskeletal: Positive for back pain, joint pain and neck pain.  Skin: Negative.   Neurological: Negative.   Endo/Heme/Allergies: Negative.   Psychiatric/Behavioral: Negative.     Objective   Vitals:   03/31/19 0954  BP: 133/75  Pulse: 84  Resp: 16  Temp: 98.1 F (36.7 C)  SpO2: 93%    Physical Exam Vitals reviewed.  Constitutional:      Appearance: Normal appearance. He is not ill-appearing.  HENT:     Head: Normocephalic and atraumatic.  Cardiovascular:     Rate and Rhythm: Normal rate and regular rhythm.     Heart sounds: Normal heart sounds. No murmur. No friction rub. No gallop.   Pulmonary:     Effort: Pulmonary effort is normal. No respiratory distress.     Breath sounds: Normal breath sounds. No stridor. No wheezing, rhonchi or rales.  Abdominal:     General: Abdomen is flat. There is no distension.     Palpations: Abdomen is soft. There is no mass.     Tenderness: There is no abdominal tenderness. There is no guarding or rebound.     Hernia: A hernia is present.     Comments:   Hernia is palpable along the superior aspect of the umbilicus.  It is reducible.  Skin:    General: Skin is warm and dry.  Neurological:     Mental Status: He is alert and oriented to person, place, and time.     Assessment  Umbilical hernia Plan   Patient is scheduled for an umbilical herniorrhaphy with mesh on 04/06/2019.  The risks and benefits of the procedure including bleeding, infection, mesh use, the possibility of recurrence of the hernia were fully explained to the patient, who gave informed consent.

## 2019-04-01 NOTE — Patient Instructions (Signed)
Samuel Bird  04/01/2019     @PREFPERIOPPHARMACY @   Your procedure is scheduled on  04/06/2019 .  Report to [facility] at  [phone]  A.M.  Call this number if you have problems the morning of surgery:  [phone]   Remember:  Do not eat or drink after midnight.                     Take these medicines the morning of surgery with A SIP OF WATER  Gabpentin, prilosec, flomax. Use your nebulizer and your inhalers before you come and bring your rescue inhaler with you.    Do not wear jewelry, make-up or nail polish.  Do not wear lotions, powders, or perfumes. Please wear deodorant and brush your teeth.  Do not shave 48 hours prior to surgery.  Men may shave face and neck.  Do not bring valuables to the hospital.  [facility] is not responsible for any belongings or valuables.  Contacts, dentures or bridgework may not be worn into surgery.  Leave your suitcase in the car.  After surgery it may be brought to your room.  For patients admitted to the hospital, discharge time will be determined by your treatment team.  Patients discharged the day of surgery will not be allowed to drive home.   Name and phone number of your driver:   Family Special instructions:  None  Please read over the following fact sheets that you were given. Anesthesia Post-op Instructions and Care and Recovery After Surgery       Open Hernia Repair, Adult, Care After These instructions give you information about caring for yourself after your procedure. Your doctor may also give you more specific instructions. If you have problems or questions, contact your doctor. Follow these instructions at home: Surgical cut (incision) care   Follow instructions from your doctor about how to take care of your surgical cut area. Make sure you: ? Wash your hands with soap and water before you change your bandage (dressing). If you cannot use soap and water, use hand sanitizer. ? Change your bandage as told by  your doctor. ? Leave stitches (sutures), skin glue, or skin tape (adhesive) strips in place. They may need to stay in place for 2 weeks or longer. If tape strips get loose and curl up, you may trim the loose edges. Do not remove tape strips completely unless your doctor says it is okay.  Check your surgical cut every day for signs of infection. Check for: ? More redness, swelling, or pain. ? More fluid or blood. ? Warmth. ? Pus or a bad smell. Activity  Do not drive or use heavy machinery while taking prescription pain medicine. Do not drive until your doctor says it is okay.  Until your doctor says it is okay: ? Do not lift anything that is heavier than 10 lb (4.5 kg). ? Do not play contact sports.  Return to your normal activities as told by your doctor. Ask your doctor what activities are safe. General instructions  To prevent or treat having a hard time pooping (constipation) while you are taking prescription pain medicine, your doctor may recommend that you: ? Drink enough fluid to keep your pee (urine) clear or pale yellow. ? Take over-the-counter or prescription medicines. ? Eat foods that are high in fiber, such as fresh fruits and vegetables, whole grains, and beans. ? Limit foods that are high in fat and  processed sugars, such as fried and sweet foods.  Take over-the-counter and prescription medicines only as told by your doctor.  Do not take baths, swim, or use a hot tub until your doctor says it is okay.  Keep all follow-up visits as told by your doctor. This is important. Contact a doctor if:  You develop a rash.  You have more redness, swelling, or pain around your surgical cut.  You have more fluid or blood coming from your surgical cut.  Your surgical cut feels warm to the touch.  You have pus or a bad smell coming from your surgical cut.  You have a fever or chills.  You have blood in your poop (stool).  You have not pooped in 2-3 days.  Medicine does  not help your pain. Get help right away if:  You have chest pain or you are short of breath.  You feel light-headed.  You feel weak and dizzy (feel faint).  You have very bad pain.  You throw up (vomit) and your pain is worse. This information is not intended to replace advice given to you by your health care provider. Make sure you discuss any questions you have with your health care provider. Document Revised: 06/25/2018 Document Reviewed: 08/15/2015 Elsevier Patient Education  2020 [place] Anesthesia, Adult, Care After This sheet gives you information about how to care for yourself after your procedure. Your health care provider may also give you more specific instructions. If you have problems or questions, contact your health care provider. What can I expect after the procedure? After the procedure, the following side effects are common:  Pain or discomfort at the IV site.  Nausea.  Vomiting.  Sore throat.  Trouble concentrating.  Feeling cold or chills.  Weak or tired.  Sleepiness and fatigue.  Soreness and body aches. These side effects can affect parts of the body that were not involved in surgery. Follow these instructions at home:  For at least 24 hours after the procedure:  Have a responsible adult stay with you. It is important to have someone help care for you until you are awake and alert.  Rest as needed.  Do not: ? Participate in activities in which you could fall or become injured. ? Drive. ? Use heavy machinery. ? Drink alcohol. ? Take sleeping pills or medicines that cause drowsiness. ? Make important decisions or sign legal documents. ? Take care of children on your own. Eating and drinking  Follow any instructions from your health care provider about eating or drinking restrictions.  When you feel hungry, start by eating small amounts of foods that are soft and easy to digest (bland), such as toast. Gradually return to  your regular diet.  Drink enough fluid to keep your urine pale yellow.  If you vomit, rehydrate by drinking water, juice, or clear broth. General instructions  If you have sleep apnea, surgery and certain medicines can increase your risk for breathing problems. Follow instructions from your health care provider about wearing your sleep device: ? Anytime you are sleeping, including during daytime naps. ? While taking prescription pain medicines, sleeping medicines, or medicines that make you drowsy.  Return to your normal activities as told by your health care provider. Ask your health care provider what activities are safe for you.  Take over-the-counter and prescription medicines only as told by your health care provider.  If you smoke, do not smoke without supervision.  Keep all follow-up visits as told  by your health care provider. This is important. Contact a health care provider if:  You have nausea or vomiting that does not get better with medicine.  You cannot eat or drink without vomiting.  You have pain that does not get better with medicine.  You are unable to pass urine.  You develop a skin rash.  You have a fever.  You have redness around your IV site that gets worse. Get help right away if:  You have difficulty breathing.  You have chest pain.  You have blood in your urine or stool, or you vomit blood. Summary  After the procedure, it is common to have a sore throat or nausea. It is also common to feel tired.  Have a responsible adult stay with you for the first 24 hours after general anesthesia. It is important to have someone help care for you until you are awake and alert.  When you feel hungry, start by eating small amounts of foods that are soft and easy to digest (bland), such as toast. Gradually return to your regular diet.  Drink enough fluid to keep your urine pale yellow.  Return to your normal activities as told by your health care provider.  Ask your health care provider what activities are safe for you. This information is not intended to replace advice given to you by your health care provider. Make sure you discuss any questions you have with your health care provider. Document Revised: 03/06/2017 Document Reviewed: 10/17/2016 Elsevier Patient Education  [place]. How to Use Chlorhexidine for Bathing Chlorhexidine gluconate (CHG) is a germ-killing (antiseptic) solution that is used to clean the skin. It can get rid of the bacteria that normally live on the skin and can keep them away for about 24 hours. To clean your skin with CHG, you may be given:  A CHG solution to use in the shower or as part of a sponge bath.  A prepackaged cloth that contains CHG. Cleaning your skin with CHG may help lower the risk for infection:  While you are staying in the intensive care unit of the hospital.  If you have a vascular access, such as a central line, to provide short-term or long-term access to your veins.  If you have a catheter to drain urine from your bladder.  If you are on a ventilator. A ventilator is a machine that helps you breathe by moving air in and out of your lungs.  After surgery. What are the risks? Risks of using CHG include:  A skin reaction.  Hearing loss, if CHG gets in your ears.  Eye injury, if CHG gets in your eyes and is not rinsed out.  The CHG product catching fire. Make sure that you avoid smoking and flames after applying CHG to your skin. Do not use CHG:  If you have a chlorhexidine allergy or have previously reacted to chlorhexidine.  On babies younger than 75 months of age. How to use CHG solution  Use CHG only as told by your health care provider, and follow the instructions on the label.  Use the full amount of CHG as directed. Usually, this is one bottle. During a shower Follow these steps when using CHG solution during a shower (unless your health care provider gives you  different instructions): 1. Start the shower. 2. Use your normal soap and shampoo to wash your face and hair. 3. Turn off the shower or move out of the shower stream. 4. Pour the CHG  onto a clean washcloth. Do not use any type of brush or rough-edged sponge. 5. Starting at your neck, lather your body down to your toes. Make sure you follow these instructions: ? If you will be having surgery, pay special attention to the part of your body where you will be having surgery. Scrub this area for at least 1 minute. ? Do not use CHG on your head or face. If the solution gets into your ears or eyes, rinse them well with water. ? Avoid your genital area. ? Avoid any areas of skin that have broken skin, cuts, or scrapes. ? Scrub your back and under your arms. Make sure to wash skin folds. 6. Let the lather sit on your skin for 1-2 minutes or as long as told by your health care provider. 7. Thoroughly rinse your entire body in the shower. Make sure that all body creases and crevices are rinsed well. 8. Dry off with a clean towel. Do not put any substances on your body afterward--such as powder, lotion, or perfume--unless you are told to do so by your health care provider. Only use lotions that are recommended by the manufacturer. 9. Put on clean clothes or pajamas. 10. If it is the night before your surgery, sleep in clean sheets.  During a sponge bath Follow these steps when using CHG solution during a sponge bath (unless your health care provider gives you different instructions): 1. Use your normal soap and shampoo to wash your face and hair. 2. Pour the CHG onto a clean washcloth. 3. Starting at your neck, lather your body down to your toes. Make sure you follow these instructions: ? If you will be having surgery, pay special attention to the part of your body where you will be having surgery. Scrub this area for at least 1 minute. ? Do not use CHG on your head or face. If the solution gets into your  ears or eyes, rinse them well with water. ? Avoid your genital area. ? Avoid any areas of skin that have broken skin, cuts, or scrapes. ? Scrub your back and under your arms. Make sure to wash skin folds. 4. Let the lather sit on your skin for 1-2 minutes or as long as told by your health care provider. 5. Using a different clean, wet washcloth, thoroughly rinse your entire body. Make sure that all body creases and crevices are rinsed well. 6. Dry off with a clean towel. Do not put any substances on your body afterward--such as powder, lotion, or perfume--unless you are told to do so by your health care provider. Only use lotions that are recommended by the manufacturer. 7. Put on clean clothes or pajamas. 8. If it is the night before your surgery, sleep in clean sheets. How to use CHG prepackaged cloths  Only use CHG cloths as told by your health care provider, and follow the instructions on the label.  Use the CHG cloth on clean, dry skin.  Do not use the CHG cloth on your head or face unless your health care provider tells you to.  When washing with the CHG cloth: ? Avoid your genital area. ? Avoid any areas of skin that have broken skin, cuts, or scrapes. Before surgery Follow these steps when using a CHG cloth to clean before surgery (unless your health care provider gives you different instructions): 1. Using the CHG cloth, vigorously scrub the part of your body where you will be having surgery. Scrub using a back-and-forth  motion for 3 minutes. The area on your body should be completely wet with CHG when you are done scrubbing. 2. Do not rinse. Discard the cloth and let the area air-dry. Do not put any substances on the area afterward, such as powder, lotion, or perfume. 3. Put on clean clothes or pajamas. 4. If it is the night before your surgery, sleep in clean sheets.  For general bathing Follow these steps when using CHG cloths for general bathing (unless your health care  provider gives you different instructions). 1. Use a separate CHG cloth for each area of your body. Make sure you wash between any folds of skin and between your fingers and toes. Wash your body in the following order, switching to a new cloth after each step: ? The front of your neck, shoulders, and chest. ? Both of your arms, under your arms, and your hands. ? Your stomach and groin area, avoiding the genitals. ? Your right leg and foot. ? Your left leg and foot. ? The back of your neck, your back, and your buttocks. 2. Do not rinse. Discard the cloth and let the area air-dry. Do not put any substances on your body afterward--such as powder, lotion, or perfume--unless you are told to do so by your health care provider. Only use lotions that are recommended by the manufacturer. 3. Put on clean clothes or pajamas. Contact a health care provider if:  Your skin gets irritated after scrubbing.  You have questions about using your solution or cloth. Get help right away if:  Your eyes become very red or swollen.  Your eyes itch badly.  Your skin itches badly and is red or swollen.  Your hearing changes.  You have trouble seeing.  You have swelling or tingling in your mouth or throat.  You have trouble breathing.  You swallow any chlorhexidine. Summary  Chlorhexidine gluconate (CHG) is a germ-killing (antiseptic) solution that is used to clean the skin. Cleaning your skin with CHG may help to lower your risk for infection.  You may be given CHG to use for bathing. It may be in a bottle or in a prepackaged cloth to use on your skin. Carefully follow your health care provider's instructions and the instructions on the product label.  Do not use CHG if you have a chlorhexidine allergy.  Contact your health care provider if your skin gets irritated after scrubbing. This information is not intended to replace advice given to you by your health care provider. Make sure you discuss any  questions you have with your health care provider. Document Revised: 05/20/2018 Document Reviewed: 01/29/2017 Elsevier Patient Education  [place].

## 2019-04-01 NOTE — H&P (Signed)
Samuel Bird; [id number]; 1950-08-27   HPI Patient is a 69 year old Hispanic male who was referred to my care by Waynard Reeds and Dr. Trinda Pascal for evaluation treatment of an umbilical hernia.  Patient states is been present for some time, but now is increasing in size and causing discomfort.  He is able to reduce it on his own when lying back.  Is made worse with straining.  He currently has a pain level of 1 out of 10. Past Medical History:  Diagnosis Date  . COPD (chronic obstructive pulmonary disease) ([place])   . Diabetes mellitus ([place])   . GERD (gastroesophageal reflux disease)   . Hyperlipidemia   . Kidney stone     Past Surgical History:  Procedure Laterality Date  . APPENDECTOMY    . COLONOSCOPY N/A 05/31/2015   Procedure: COLONOSCOPY;  Surgeon: Danie Binder, MD;  Location: [facility] ENDO SU[facility];  Service: Endoscopy;  Laterality: N/A;  830  . COLOSTOMY    . ESOPHAGOGASTRODUODENOSCOPY N/A 05/31/2015   Procedure: ESOPHAGOGASTRODUODENOSCOPY (EGD);  Surgeon: Danie Binder, MD;  Location: [facility] SUITE;  Service: Endoscopy;  Laterality: N/A;  . NECK SURGERY      Family History  Problem Relation Age of Onset  . Colon cancer Neg Hx   . Colon polyps Neg Hx     Current Outpatient Medications on File Prior to Visit  Medication Sig Dispense Refill  . acetaminophen (TYLENOL) 500 MG tablet Take by mouth daily as needed.    Marland Kitchen albuterol (PROVENTIL) (2.5 MG/3ML) 0.083% nebulizer solution albuterol sulfate 2.5 mg/3 mL (0.083 %) solution for nebulization    . aspirin EC 81 MG tablet Take 81 mg by mouth daily.    . cycloSPORINE (RESTASIS) 0.05 % ophthalmic emulsion Apply to eye 2 (two) times daily.    . fluticasone (FLONASE) 50 MCG/ACT nasal spray Place into the nose.    . gabapentin (NEURONTIN) 300 MG capsule 300 mg 2 (two) times daily.    Marland Kitchen ibuprofen (ADVIL,MOTRIN) 400 MG tablet Take 1 tablet (400 mg total) by mouth 3 (three) times daily. 30 tablet 0  . lactase (LACTASE ENZYME) 3000 units tablet  Take 2 tablets (6,000 Units total) by mouth 3 (three) times daily with meals. 300 tablet 11  . metFORMIN (GLUCOPHAGE) 1000 MG tablet 500 mg 2 (two) times daily with a meal.     . omeprazole (PRILOSEC) 40 MG capsule Take 40 mg by mouth daily.    . simvastatin (ZOCOR) 40 MG tablet Take 40 mg by mouth daily.    . sitaGLIPtin (JANUVIA) 50 MG tablet Take 50 mg by mouth daily.    . SYMBICORT 80-4.5 MCG/ACT inhaler Inhale 2 puffs into the lungs 2 (two) times a day.    . tamsulosin (FLOMAX) 0.4 MG CAPS capsule Take 1 capsule (0.4 mg total) by mouth daily after supper. 10 capsule 0  . Tiotropium Bromide Monohydrate (SPIRIVA RESPIMAT) 2.5 MCG/ACT AERS Inhale into the lungs.    Marland Kitchen albuterol (VENTOLIN HFA) 108 (90 Base) MCG/ACT inhaler Inhale 2 puffs into the lungs as needed.    . carisoprodol (SOMA) 350 MG tablet 350 mg daily as needed.    Marland Kitchen HYDROcodone-acetaminophen (NORCO/VICODIN) 5-325 MG tablet Take 1 tablet by mouth every 4 (four) hours as needed. (Patient not taking: Reported on 08/19/2018) 10 tablet 0  . ondansetron (ZOFRAN) 4 MG tablet Take 1 tablet (4 mg total) by mouth every 6 (six) hours. (Patient not taking: Reported on 08/19/2018) 12 tablet 0   No current  facility-administered medications on file prior to visit.    Allergies  Allergen Reactions  . Fluticasone-Salmeterol   . Glipizide Nausea And Vomiting  . Naproxen Other (See Comments)    "messing with his kidneys"    Social History   Substance and Sexual Activity  Alcohol Use No  . Alcohol/week: 0.0 standard drinks    Social History   Tobacco Use  Smoking Status Former Smoker  . Packs/day: 0.50  . Years: 43.00  . Pack years: 21.50  . Quit date: 05/30/2008  . Years since quitting: 10.8  Smokeless Tobacco Never Used    Review of Systems  Constitutional: Negative.   HENT: Positive for sinus pain.   Eyes: Positive for blurred vision.  Respiratory: Positive for shortness of breath and wheezing.   Cardiovascular: Negative.    Gastrointestinal: Negative.   Genitourinary: Negative.   Musculoskeletal: Positive for back pain, joint pain and neck pain.  Skin: Negative.   Neurological: Negative.   Endo/Heme/Allergies: Negative.   Psychiatric/Behavioral: Negative.     Objective   Vitals:   03/31/19 0954  BP: 133/75  Pulse: 84  Resp: 16  Temp: 98.1 F (36.7 C)  SpO2: 93%    Physical Exam Vitals reviewed.  Constitutional:      Appearance: Normal appearance. He is not ill-appearing.  HENT:     Head: Normocephalic and atraumatic.  Cardiovascular:     Rate and Rhythm: Normal rate and regular rhythm.     Heart sounds: Normal heart sounds. No murmur. No friction rub. No gallop.   Pulmonary:     Effort: Pulmonary effort is normal. No respiratory distress.     Breath sounds: Normal breath sounds. No stridor. No wheezing, rhonchi or rales.  Abdominal:     General: Abdomen is flat. There is no distension.     Palpations: Abdomen is soft. There is no mass.     Tenderness: There is no abdominal tenderness. There is no guarding or rebound.     Hernia: A hernia is present.     Comments:   Hernia is palpable along the superior aspect of the umbilicus.  It is reducible.  Skin:    General: Skin is warm and dry.  Neurological:     Mental Status: He is alert and oriented to person, place, and time.     Assessment  Umbilical hernia Plan   Patient is scheduled for an umbilical herniorrhaphy with mesh on 04/06/2019.  The risks and benefits of the procedure including bleeding, infection, mesh use, the possibility of recurrence of the hernia were fully explained to the patient, who gave informed consent.

## 2019-04-04 ENCOUNTER — Encounter (HOSPITAL_COMMUNITY): Payer: Self-pay

## 2019-04-04 ENCOUNTER — Other Ambulatory Visit (HOSPITAL_COMMUNITY)
Admission: RE | Admit: 2019-04-04 | Discharge: 2019-04-04 | Disposition: A | Discharge status: Home / Self Care | Payer: Medicare Other | Source: Ambulatory Visit | Attending: General Surgery | Admitting: General Surgery

## 2019-04-04 ENCOUNTER — Encounter (HOSPITAL_COMMUNITY)
Admission: RE | Admit: 2019-04-04 | Discharge: 2019-04-04 | Disposition: A | Discharge status: Home / Self Care | Payer: Medicare Other | Source: Ambulatory Visit | Attending: General Surgery | Admitting: General Surgery

## 2019-04-04 ENCOUNTER — Other Ambulatory Visit: Payer: Self-pay

## 2019-04-04 DIAGNOSIS — Z01812 Encounter for preprocedural laboratory examination: Secondary | ICD-10-CM | POA: Diagnosis present

## 2019-04-04 HISTORY — DX: Unspecified osteoarthritis, unspecified site: M19.90

## 2019-04-04 LAB — BASIC METABOLIC PANEL
Anion gap: 8 (ref 5–15)
BUN: 15 mg/dL (ref 8–23)
CO2: 24 mmol/L (ref 22–32)
Calcium: 9.1 mg/dL (ref 8.9–10.3)
Chloride: 107 mmol/L (ref 98–111)
Creatinine, Ser: 1.27 mg/dL — ABNORMAL HIGH (ref 0.61–1.24)
GFR calc Af Amer: >60 mL/min (ref >60)
GFR calc non Af Amer: 58 mL/min — ABNORMAL LOW (ref >60)
Glucose, Bld: 162 mg/dL — ABNORMAL HIGH (ref 70–99)
Potassium: 4.8 mmol/L (ref 3.5–5.1)
Sodium: 139 mmol/L (ref 135–145)

## 2019-04-04 LAB — CBC WITH DIFFERENTIAL/PLATELET
Abs Immature Granulocytes: 0.02 10*3/uL (ref 0.00–0.07)
Basophils Absolute: 0.1 10*3/uL (ref 0.0–0.1)
Basophils Relative: 1 %
Eosinophils Absolute: 0.3 10*3/uL (ref 0.0–0.5)
Eosinophils Relative: 4 %
HCT: 43.6 % (ref 39.0–52.0)
Hemoglobin: 13.6 g/dL (ref 13.0–17.0)
Immature Granulocytes: 0 %
Lymphocytes Relative: 23 %
Lymphs Abs: 1.6 10*3/uL (ref 0.7–4.0)
MCH: 28.9 pg (ref 26.0–34.0)
MCHC: 31.2 g/dL (ref 30.0–36.0)
MCV: 92.6 fL (ref 80.0–100.0)
Monocytes Absolute: 0.7 10*3/uL (ref 0.1–1.0)
Monocytes Relative: 11 %
Neutro Abs: 4.2 10*3/uL (ref 1.7–7.7)
Neutrophils Relative %: 61 %
Platelets: 213 10*3/uL (ref 150–400)
RBC: 4.71 MIL/uL (ref 4.22–5.81)
RDW: 13 % (ref 11.5–15.5)
WBC: 7 10*3/uL (ref 4.0–10.5)
nRBC: 0 % (ref 0.0–0.2)

## 2019-04-04 LAB — HEMOGLOBIN A1C
Hgb A1c MFr Bld: 7.7 % — ABNORMAL HIGH (ref 4.8–5.6)
Mean Plasma Glucose: 174.29 mg/dL

## 2019-04-04 LAB — SARS CORONAVIRUS 2 (TAT 6-24 HRS): SARS Coronavirus 2: NEGATIVE

## 2019-04-06 ENCOUNTER — Ambulatory Visit (HOSPITAL_COMMUNITY): Payer: Medicare Other | Admitting: Anesthesiology

## 2019-04-06 ENCOUNTER — Encounter (HOSPITAL_COMMUNITY)
Admission: RE | Disposition: A | Discharge status: Home / Self Care | Payer: Self-pay | Source: Home / Self Care | Attending: General Surgery

## 2019-04-06 ENCOUNTER — Ambulatory Visit (HOSPITAL_COMMUNITY)
Admission: RE | Admit: 2019-04-06 | Discharge: 2019-04-06 | Disposition: A | Discharge status: Home / Self Care | Payer: Medicare Other | Attending: General Surgery | Admitting: General Surgery

## 2019-04-06 ENCOUNTER — Encounter (HOSPITAL_COMMUNITY): Payer: Self-pay | Admitting: General Surgery

## 2019-04-06 DIAGNOSIS — E785 Hyperlipidemia, unspecified: Secondary | ICD-10-CM | POA: Diagnosis not present

## 2019-04-06 DIAGNOSIS — K429 Umbilical hernia without obstruction or gangrene: Secondary | ICD-10-CM | POA: Insufficient documentation

## 2019-04-06 DIAGNOSIS — Z7984 Long term (current) use of oral hypoglycemic drugs: Secondary | ICD-10-CM | POA: Insufficient documentation

## 2019-04-06 DIAGNOSIS — Z791 Long term (current) use of non-steroidal anti-inflammatories (NSAID): Secondary | ICD-10-CM | POA: Diagnosis not present

## 2019-04-06 DIAGNOSIS — Z7951 Long term (current) use of inhaled steroids: Secondary | ICD-10-CM | POA: Diagnosis not present

## 2019-04-06 DIAGNOSIS — Z87891 Personal history of nicotine dependence: Secondary | ICD-10-CM | POA: Diagnosis not present

## 2019-04-06 DIAGNOSIS — Z87442 Personal history of urinary calculi: Secondary | ICD-10-CM | POA: Diagnosis not present

## 2019-04-06 DIAGNOSIS — Z7982 Long term (current) use of aspirin: Secondary | ICD-10-CM | POA: Diagnosis not present

## 2019-04-06 DIAGNOSIS — E119 Type 2 diabetes mellitus without complications: Secondary | ICD-10-CM | POA: Insufficient documentation

## 2019-04-06 DIAGNOSIS — Z79899 Other long term (current) drug therapy: Secondary | ICD-10-CM | POA: Diagnosis not present

## 2019-04-06 DIAGNOSIS — J449 Chronic obstructive pulmonary disease, unspecified: Secondary | ICD-10-CM | POA: Diagnosis not present

## 2019-04-06 DIAGNOSIS — K219 Gastro-esophageal reflux disease without esophagitis: Secondary | ICD-10-CM | POA: Insufficient documentation

## 2019-04-06 HISTORY — PX: UMBILICAL HERNIA REPAIR: SHX196

## 2019-04-06 LAB — GLUCOSE, CAPILLARY
Glucose-Capillary: 179 mg/dL — ABNORMAL HIGH (ref 70–99)
Glucose-Capillary: 173 mg/dL — ABNORMAL HIGH (ref 70–99)

## 2019-04-06 SURGERY — REPAIR, HERNIA, UMBILICAL, ADULT
Anesthesia: General | Site: Abdomen

## 2019-04-06 MED ORDER — LACTATED RINGERS IV SOLN
Freq: Once | INTRAVENOUS | Status: AC
  Administered 2019-04-06: 1000 mL via INTRAVENOUS

## 2019-04-06 MED ORDER — ONDANSETRON HCL 4 MG/2ML IJ SOLN
4.0000 mg | Freq: Once | INTRAMUSCULAR | Status: AC | PRN
  Administered 2019-04-06: 4 mg via INTRAVENOUS
  Filled 2019-04-06: qty 2

## 2019-04-06 MED ORDER — LIDOCAINE HCL (CARDIAC) PF 50 MG/5ML IV SOSY
PREFILLED_SYRINGE | INTRAVENOUS | Status: DC | PRN
  Administered 2019-04-06: 70 mg via INTRAVENOUS

## 2019-04-06 MED ORDER — FENTANYL CITRATE (PF) 100 MCG/2ML IJ SOLN
INTRAMUSCULAR | Status: DC | PRN
  Administered 2019-04-06 (×3): 50 ug via INTRAVENOUS

## 2019-04-06 MED ORDER — CHLORHEXIDINE GLUCONATE CLOTH 2 % EX PADS: 6.0000 | MEDICATED_PAD | Freq: Once | CUTANEOUS | Status: DC

## 2019-04-06 MED ORDER — ONDANSETRON HCL 4 MG/2ML IJ SOLN
INTRAMUSCULAR | Status: AC
  Filled 2019-04-06: qty 2

## 2019-04-06 MED ORDER — MEPERIDINE HCL 50 MG/ML IJ SOLN: 6.2500 mg | INTRAMUSCULAR | Status: DC | PRN

## 2019-04-06 MED ORDER — SUGAMMADEX SODIUM 200 MG/2ML IV SOLN
INTRAVENOUS | Status: DC | PRN
  Administered 2019-04-06: 200 mg via INTRAVENOUS

## 2019-04-06 MED ORDER — ONDANSETRON HCL 4 MG/2ML IJ SOLN
INTRAMUSCULAR | Status: DC | PRN
  Administered 2019-04-06: 4 mg via INTRAVENOUS

## 2019-04-06 MED ORDER — CEFAZOLIN SODIUM-DEXTROSE 2-4 GM/100ML-% IV SOLN
2.0000 g | INTRAVENOUS | Status: AC
  Administered 2019-04-06: 2 g via INTRAVENOUS

## 2019-04-06 MED ORDER — SUCCINYLCHOLINE CHLORIDE 200 MG/10ML IV SOSY
PREFILLED_SYRINGE | INTRAVENOUS | Status: AC
  Filled 2019-04-06: qty 10

## 2019-04-06 MED ORDER — PHENYLEPHRINE HCL (PRESSORS) 10 MG/ML IV SOLN
INTRAVENOUS | Status: DC | PRN
  Administered 2019-04-06: 80 ug via INTRAVENOUS

## 2019-04-06 MED ORDER — PROPOFOL 10 MG/ML IV BOLUS
INTRAVENOUS | Status: AC
  Filled 2019-04-06: qty 40

## 2019-04-06 MED ORDER — BUPIVACAINE LIPOSOME 1.3 % IJ SUSP
INTRAMUSCULAR | Status: DC | PRN
  Administered 2019-04-06: 20 mL

## 2019-04-06 MED ORDER — ROCURONIUM 10MG/ML (10ML) SYRINGE FOR MEDFUSION PUMP - OPTIME
INTRAVENOUS | Status: DC | PRN
  Administered 2019-04-06: 15 mg via INTRAVENOUS
  Administered 2019-04-06: 10 mg via INTRAVENOUS

## 2019-04-06 MED ORDER — HYDROCODONE-ACETAMINOPHEN 5-325 MG PO TABS: 1.0000 | ORAL_TABLET | ORAL | 0 refills | Status: DC | PRN

## 2019-04-06 MED ORDER — LACTATED RINGERS IV SOLN: INTRAVENOUS | Status: DC | PRN

## 2019-04-06 MED ORDER — HYDROMORPHONE HCL 1 MG/ML IJ SOLN
0.2500 mg | INTRAMUSCULAR | Status: DC | PRN
  Administered 2019-04-06 (×2): 0.5 mg via INTRAVENOUS
  Filled 2019-04-06 (×2): qty 0.5

## 2019-04-06 MED ORDER — SUCCINYLCHOLINE 20MG/ML (10ML) SYRINGE FOR MEDFUSION PUMP - OPTIME
INTRAMUSCULAR | Status: DC | PRN
  Administered 2019-04-06: 120 mg via INTRAVENOUS

## 2019-04-06 MED ORDER — ROCURONIUM BROMIDE 10 MG/ML (PF) SYRINGE
PREFILLED_SYRINGE | INTRAVENOUS | Status: AC
  Filled 2019-04-06: qty 10

## 2019-04-06 MED ORDER — MIDAZOLAM HCL 2 MG/2ML IJ SOLN
INTRAMUSCULAR | Status: AC
  Filled 2019-04-06: qty 2

## 2019-04-06 MED ORDER — MIDAZOLAM HCL 5 MG/5ML IJ SOLN
INTRAMUSCULAR | Status: DC | PRN
  Administered 2019-04-06: 1 mg via INTRAVENOUS

## 2019-04-06 MED ORDER — PROPOFOL 10 MG/ML IV BOLUS
INTRAVENOUS | Status: DC | PRN
  Administered 2019-04-06: 180 mg via INTRAVENOUS

## 2019-04-06 MED ORDER — CEFAZOLIN SODIUM-DEXTROSE 2-4 GM/100ML-% IV SOLN
INTRAVENOUS | Status: AC
  Filled 2019-04-06: qty 100

## 2019-04-06 MED ORDER — BUPIVACAINE LIPOSOME 1.3 % IJ SUSP
INTRAMUSCULAR | Status: AC
  Filled 2019-04-06: qty 20

## 2019-04-06 MED ORDER — FENTANYL CITRATE (PF) 250 MCG/5ML IJ SOLN
INTRAMUSCULAR | Status: AC
  Filled 2019-04-06: qty 5

## 2019-04-06 MED ORDER — SODIUM CHLORIDE 0.9 % IR SOLN
Status: DC | PRN
  Administered 2019-04-06: 1000 mL

## 2019-04-06 SURGICAL SUPPLY — 36 items
BLADE SURG SZ11 CARB STEEL (BLADE) ×3 IMPLANT
CHLORAPREP W/TINT 26 (MISCELLANEOUS) ×3 IMPLANT
CLOTH BEACON ORANGE TIMEOUT ST (SAFETY) ×3 IMPLANT
COVER LIGHT HANDLE STERIS (MISCELLANEOUS) ×6 IMPLANT
COVER WAND RF STERILE (DRAPES) ×3 IMPLANT
DERMABOND ADVANCED (GAUZE/BANDAGES/DRESSINGS) ×2
DERMABOND ADVANCED .7 DNX12 (GAUZE/BANDAGES/DRESSINGS) ×1 IMPLANT
ELECT REM PT RETURN 9FT ADLT (ELECTROSURGICAL) ×3
ELECTRODE REM PT RTRN 9FT ADLT (ELECTROSURGICAL) ×1 IMPLANT
GLOVE BIOGEL PI IND STRL 7.0 (GLOVE) ×2 IMPLANT
GLOVE BIOGEL PI IND STRL 7.5 (GLOVE) IMPLANT
GLOVE BIOGEL PI INDICATOR 7.0 (GLOVE) ×4
GLOVE BIOGEL PI INDICATOR 7.5 (GLOVE) ×2
GLOVE ECLIPSE 6.5 STRL STRAW (GLOVE) ×2 IMPLANT
GLOVE ECLIPSE 7.0 STRL STRAW (GLOVE) ×2 IMPLANT
GLOVE SURG SS PI 7.5 STRL IVOR (GLOVE) ×3 IMPLANT
GOWN STRL REUS W/TWL LRG LVL3 (GOWN DISPOSABLE) ×8 IMPLANT
INST SET MINOR GENERAL (KITS) ×3 IMPLANT
KIT TURNOVER KIT A (KITS) ×3 IMPLANT
MANIFOLD NEPTUNE II (INSTRUMENTS) ×3 IMPLANT
MESH VENTRALEX ST 2.5 CRC MED (Mesh General) ×2 IMPLANT
NDL HYPO 18GX1.5 BLUNT FILL (NEEDLE) ×1 IMPLANT
NDL HYPO 21X1.5 SAFETY (NEEDLE) ×1 IMPLANT
NEEDLE HYPO 18GX1.5 BLUNT FILL (NEEDLE) ×3 IMPLANT
NEEDLE HYPO 21X1.5 SAFETY (NEEDLE) ×3 IMPLANT
NS IRRIG 1000ML POUR BTL (IV SOLUTION) ×3 IMPLANT
PACK MINOR (CUSTOM PROCEDURE TRAY) ×3 IMPLANT
PAD ARMBOARD 7.5X6 YLW CONV (MISCELLANEOUS) ×3 IMPLANT
SET BASIN LINEN APH (SET/KITS/TRAYS/PACK) ×3 IMPLANT
SUT ETHIBOND NAB MO 7 #0 18IN (SUTURE) ×3 IMPLANT
SUT MNCRL AB 4-0 PS2 18 (SUTURE) ×3 IMPLANT
SUT VIC AB 2-0 CT2 27 (SUTURE) ×3 IMPLANT
SUT VIC AB 3-0 SH 27 (SUTURE) ×2
SUT VIC AB 3-0 SH 27X BRD (SUTURE) ×1 IMPLANT
SUT VICRYL AB 3 0 TIES (SUTURE) IMPLANT
SYR 20ML LL LF (SYRINGE) ×6 IMPLANT

## 2019-04-06 NOTE — Anesthesia Procedure Notes (Signed)
Procedure Name: Intubation Date/Time: 04/06/2019 7:39 AM Performed by: Ollen Bowl, CRNA Pre-anesthesia Checklist: Patient identified, Patient being monitored, Timeout performed, Emergency Drugs available and Suction available Patient Re-evaluated:Patient Re-evaluated prior to induction Oxygen Delivery Method: Circle system utilized Preoxygenation: Pre-oxygenation with 100% oxygen Induction Type: IV induction Ventilation: Mask ventilation without difficulty Laryngoscope Size: Glidescope (S3) Grade View: Grade I Tube type: Oral Tube size: 7.0 mm Number of attempts: 1 Airway Equipment and Method: Stylet Placement Confirmation: ETT inserted through vocal cords under direct vision,  positive ETCO2 and breath sounds checked- equal and bilateral Secured at: 21 cm Tube secured with: Tape Dental Injury: Teeth and Oropharynx as per pre-operative assessment

## 2019-04-06 NOTE — Transfer of Care (Addendum)
Immediate Anesthesia Transfer of Care Note  Patient: Samuel Bird  Procedure(s) Performed: HERNIA REPAIR UMBILICAL ADULT WITH MESH (N/A Abdomen)  Patient Location: PACU  Anesthesia Type:General  Level of Consciousness: awake  Airway & Oxygen Therapy: Patient Spontanous Breathing and Patient connected to nasal cannula oxygen  Post-op Assessment: Report given to RN  Post vital signs: Reviewed and stable  Last Vitals:  Vitals Value Taken Time  BP 143/86 04/06/19 0840  Temp    Pulse 106 04/06/19 0843  Resp 13 04/06/19 0843  SpO2 90 % 04/06/19 0843  Vitals shown include unvalidated device data.  Last Pain:  Vitals:   04/06/19 0658  TempSrc: Oral  PainSc: 0-No pain      Patients Stated Pain Goal: 8 [id number] [phone]  Complications: No apparent anesthesia complications

## 2019-04-06 NOTE — Anesthesia Postprocedure Evaluation (Signed)
Anesthesia Post Note  Patient: Samuel Bird  Procedure(s) Performed: HERNIA REPAIR UMBILICAL ADULT WITH MESH (N/A Abdomen)  Patient location during evaluation: PACU Anesthesia Type: General Level of consciousness: awake and alert and oriented Pain management: pain level controlled Vital Signs Assessment: post-procedure vital signs reviewed and stable Respiratory status: spontaneous breathing Cardiovascular status: blood pressure returned to baseline and stable Postop Assessment: no apparent nausea or vomiting Anesthetic complications: no     Last Vitals:  Vitals:   04/06/19 0928 04/06/19 0944  BP: (!) 141/76 (!) 162/81  Pulse: 93 98  Resp: 11 18  Temp:    SpO2: 94% 94%    Last Pain:  Vitals:   04/06/19 0949  TempSrc:   PainSc: 0-No pain                 Paisli Silfies

## 2019-04-06 NOTE — Anesthesia Preprocedure Evaluation (Addendum)
Anesthesia Evaluation  Patient identified by MRN, date of birth, ID band Patient awake    Reviewed: Allergy & Precautions, NPO status , Patient's Chart, lab work & pertinent test results  History of Anesthesia Complications (+) history of anesthetic complications  Airway Mallampati: III  TM Distance: >3 FB Neck ROM: Full   Comment: Neck sx - ACDF?, Neck pain Dental  (+) Caps, Missing, Dental Advisory Given   Pulmonary sleep apnea and Continuous Positive Airway Pressure Ventilation , COPD,  COPD inhaler, former smoker,    Pulmonary exam normal breath sounds clear to auscultation       Cardiovascular Exercise Tolerance: Good METS: 3 - Mets Normal cardiovascular exam Rhythm:Regular Rate:Normal  EKG 04/06/19 - SR, Unusual P axis, 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   Neuro/Psych negative neurological ROS  negative psych ROS   GI/Hepatic Neg liver ROS, GERD  Medicated,  Endo/Other  diabetes, Well Controlled, Type 2  Renal/GU Renal InsufficiencyRenal disease  negative genitourinary   Musculoskeletal  (+) Arthritis  (neck pain and back pain), Osteoarthritis,    Abdominal   Peds negative pediatric ROS (+)  Hematology negative hematology ROS (+)   Anesthesia Other Findings   Reproductive/Obstetrics                            Anesthesia Physical Anesthesia Plan  ASA: III  Anesthesia Plan: General   Post-op Pain Management:    Induction: Intravenous  PONV Risk Score and Plan: 3 and Ondansetron, Dexamethasone and Midazolam  Airway Management Planned: Oral ETT  Additional Equipment:   Intra-op Plan:   Post-operative Plan: Extubation in OR  Informed Consent: I have reviewed the patients History and Physical, chart, labs and discussed the procedure including the risks, benefits and alternatives for the proposed anesthesia with the patient or authorized representative who has indicated his/her  understanding and acceptance.     Dental advisory given  Plan Discussed with: CRNA  Anesthesia Plan Comments:         Anesthesia Quick Evaluation

## 2019-04-06 NOTE — Interval H&P Note (Signed)
History and Physical Interval Note:  04/06/2019 7:10 AM  Samuel Bird  has presented today for surgery, with the diagnosis of UMBILICAL HERNIA.  The various methods of treatment have been discussed with the patient and family. After consideration of risks, benefits and other options for treatment, the patient has consented to  Procedure(s): HERNIA REPAIR UMBILICAL ADULT WITH MESH (N/A) as a surgical intervention.  The patient's history has been reviewed, patient examined, no change in status, stable for surgery.  I have reviewed the patient's chart and labs.  Questions were answered to the patient's satisfaction.     Aviva Signs

## 2019-04-06 NOTE — Discharge Instructions (Signed)
Open Hernia Repair, Adult, Care After This sheet gives you information about how to care for yourself after your procedure. Your health care provider may also give you more specific instructions. If you have problems or questions, contact your health care provider. What can I expect after the procedure? After the procedure, it is common to have:  Mild discomfort.  Slight bruising.  Minor swelling.  Pain in the abdomen. Follow these instructions at home: Incision care   Follow instructions from your health care provider about how to take care of your incision area. Make sure you: ? Wash your hands with soap and water before you change your bandage (dressing). If soap and water are not available, use hand sanitizer. ? Change your dressing as told by your health care provider. ? Leave stitches (sutures), skin glue, or adhesive strips in place. These skin closures may need to stay in place for 2 weeks or longer. If adhesive strip edges start to loosen and curl up, you may trim the loose edges. Do not remove adhesive strips completely unless your health care provider tells you to do that.  Check your incision area every day for signs of infection. Check for: ? More redness, swelling, or pain. ? More fluid or blood. ? Warmth. ? Pus or a bad smell. Activity  Do not drive or use heavy machinery while taking prescription pain medicine. Do not drive until your health care provider approves.  Until your health care provider approves: ? Do not lift anything that is heavier than 10 lb (4.5 kg). ? Do not play contact sports.  Return to your normal activities as told by your health care provider. Ask your health care provider what activities are safe. General instructions  To prevent or treat constipation while you are taking prescription pain medicine, your health care provider may recommend that you: ? Drink enough fluid to keep your urine clear or pale yellow. ? Take over-the-counter or  prescription medicines. ? Eat foods that are high in fiber, such as fresh fruits and vegetables, whole grains, and beans. ? Limit foods that are high in fat and processed sugars, such as fried and sweet foods.  Take over-the-counter and prescription medicines only as told by your health care provider.  Do not take tub baths or go swimming until your health care provider approves.  Keep all follow-up visits as told by your health care provider. This is important. Contact a health care provider if:  You develop a rash.  You have more redness, swelling, or pain around your incision.  You have more fluid or blood coming from your incision.  Your incision feels warm to the touch.  You have pus or a bad smell coming from your incision.  You have a fever or chills.  You have blood in your stool (feces).  You have not had a bowel movement in 2-3 days.  Your pain is not controlled with medicine. Get help right away if:  You have chest pain or shortness of breath.  You feel light-headed or feel faint.  You have severe pain.  You vomit and your pain is worse. This information is not intended to replace advice given to you by your health care provider. Make sure you discuss any questions you have with your health care provider. Document Revised: 02/13/2017 Document Reviewed: 08/15/2015 Elsevier Patient Education  2020 Elsevier Inc.   General Anesthesia, Adult, Care After This sheet gives you information about how to care for yourself after your procedure. Your   health care provider may also give you more specific instructions. If you have problems or questions, contact your health care provider. What can I expect after the procedure? After the procedure, the following side effects are common:  Pain or discomfort at the IV site.  Nausea.  Vomiting.  Sore throat.  Trouble concentrating.  Feeling cold or chills.  Weak or tired.  Sleepiness and fatigue.  Soreness and  body aches. These side effects can affect parts of the body that were not involved in surgery. Follow these instructions at home:  For at least 24 hours after the procedure:  Have a responsible adult stay with you. It is important to have someone help care for you until you are awake and alert.  Rest as needed.  Do not: ? Participate in activities in which you could fall or become injured. ? Drive. ? Use heavy machinery. ? Drink alcohol. ? Take sleeping pills or medicines that cause drowsiness. ? Make important decisions or sign legal documents. ? Take care of children on your own. Eating and drinking  Follow any instructions from your health care provider about eating or drinking restrictions.  When you feel hungry, start by eating small amounts of foods that are soft and easy to digest (bland), such as toast. Gradually return to your regular diet.  Drink enough fluid to keep your urine pale yellow.  If you vomit, rehydrate by drinking water, juice, or clear broth. General instructions  If you have sleep apnea, surgery and certain medicines can increase your risk for breathing problems. Follow instructions from your health care provider about wearing your sleep device: ? Anytime you are sleeping, including during daytime naps. ? While taking prescription pain medicines, sleeping medicines, or medicines that make you drowsy.  Return to your normal activities as told by your health care provider. Ask your health care provider what activities are safe for you.  Take over-the-counter and prescription medicines only as told by your health care provider.  If you smoke, do not smoke without supervision.  Keep all follow-up visits as told by your health care provider. This is important. Contact a health care provider if:  You have nausea or vomiting that does not get better with medicine.  You cannot eat or drink without vomiting.  You have pain that does not get better with  medicine.  You are unable to pass urine.  You develop a skin rash.  You have a fever.  You have redness around your IV site that gets worse. Get help right away if:  You have difficulty breathing.  You have chest pain.  You have blood in your urine or stool, or you vomit blood. Summary  After the procedure, it is common to have a sore throat or nausea. It is also common to feel tired.  Have a responsible adult stay with you for the first 24 hours after general anesthesia. It is important to have someone help care for you until you are awake and alert.  When you feel hungry, start by eating small amounts of foods that are soft and easy to digest (bland), such as toast. Gradually return to your regular diet.  Drink enough fluid to keep your urine pale yellow.  Return to your normal activities as told by your health care provider. Ask your health care provider what activities are safe for you. This information is not intended to replace advice given to you by your health care provider. Make sure you discuss any questions you   have with your health care provider. Document Revised: 03/06/2017 Document Reviewed: 10/17/2016 Elsevier Patient Education  2020 Elsevier Inc.   

## 2019-04-06 NOTE — Op Note (Signed)
Patient:  Samuel Bird  DOB:  [birth year]  MRN:  [id number]   Preop Diagnosis: Umbilical hernia  Postop Diagnosis: Same  Procedure: Umbilical herniorrhaphy with mesh  Surgeon: Aviva Signs, MD  Anes: General endotracheal  Indications: Patient is a 69 year old Hispanic male who presents with a symptomatic umbilical hernia.  The risks and benefits of the procedure including bleeding, infection, mesh use, the possibility of recurrence of the hernia were fully explained to the patient, who gave informed consent.  Procedure note: The patient was placed in the supine position.  After induction of general endotracheal anesthesia, the abdomen was prepped and draped using the usual sterile technique with ChloraPrep.  Surgical site confirmation was performed.    An infraumbilical incision was made down to the fascia.  The umbilicus was freed away from the underlying fascia.  The patient was noted to have a 1-1/2 to 2 cm hernia defect.  The hernia sac was excised.  The omentum was reduced back into the abdominal cavity.  Care was taken to avoid any bowel.  A 6.4 cm Bard ventralax ST patch was then inserted and secured to the fascia using 0 Ethibond interrupted sutures.  The overlying fascia was reapproximated using 0 Ethibond interrupted sutures.  The base of the umbilicus was secured back to the fascia using a 2-0 Vicryl interrupted suture.  The subcutaneous layer was reapproximated using a 3-0 Vicryl interrupted suture.  Exparel was instilled into the surrounding wound.  The skin was closed using a 4-0 Monocryl subcuticular suture.  Dermabond was applied.  All tape needle counts were correct at the end of the procedure.  The patient was extubated in the operating room and transferred to PACU in stable condition.  Complications: None  EBL: Minimal  Specimen: None

## 2019-04-15 ENCOUNTER — Telehealth (INDEPENDENT_AMBULATORY_CARE_PROVIDER_SITE_OTHER): Payer: Self-pay | Admitting: General Surgery

## 2019-04-15 ENCOUNTER — Other Ambulatory Visit: Payer: Self-pay

## 2019-04-15 DIAGNOSIS — Z09 Encounter for follow-up examination after completed treatment for conditions other than malignant neoplasm: Secondary | ICD-10-CM

## 2019-04-15 NOTE — Progress Notes (Signed)
Subjective:     Samuel Bird  Virtual postoperative visit performed.  Patient states he is doing well.  He denies any significant abdominal pain. Objective:    There were no vitals taken for this visit.  General:   Virtual visit       Assessment:    Doing well postoperatively.    Plan:   May increase activity as able.  Follow up prn.

## 2019-06-15 ENCOUNTER — Telehealth: Payer: Self-pay | Admitting: *Deleted

## 2019-06-15 NOTE — Telephone Encounter (Signed)
PA approved for EGD/DIL via [facility] website. Auth# [id number] dates 06/22/2019-09/20/2019

## 2019-06-20 ENCOUNTER — Other Ambulatory Visit: Payer: Self-pay

## 2019-06-20 ENCOUNTER — Other Ambulatory Visit (HOSPITAL_COMMUNITY)
Admission: RE | Admit: 2019-06-20 | Discharge: 2019-06-20 | Disposition: A | Discharge status: Home / Self Care | Payer: Medicare Other | Source: Ambulatory Visit | Attending: Gastroenterology | Admitting: Gastroenterology

## 2019-06-20 DIAGNOSIS — Z01812 Encounter for preprocedural laboratory examination: Secondary | ICD-10-CM | POA: Insufficient documentation

## 2019-06-20 DIAGNOSIS — Z20822 Contact with and (suspected) exposure to covid-19: Secondary | ICD-10-CM | POA: Diagnosis not present

## 2019-06-20 LAB — SARS CORONAVIRUS 2 (TAT 6-24 HRS): SARS Coronavirus 2: NEGATIVE

## 2019-06-22 ENCOUNTER — Ambulatory Visit (HOSPITAL_COMMUNITY): Admission: RE | Admit: 2019-06-22 | Payer: Medicare Other | Source: Home / Self Care | Admitting: Gastroenterology

## 2019-06-22 ENCOUNTER — Encounter (HOSPITAL_COMMUNITY): Payer: Self-pay | Admitting: Gastroenterology

## 2019-06-22 ENCOUNTER — Other Ambulatory Visit: Payer: Self-pay

## 2019-06-22 ENCOUNTER — Ambulatory Visit (HOSPITAL_COMMUNITY)
Admission: RE | Admit: 2019-06-22 | Discharge: 2019-06-22 | Disposition: A | Discharge status: Home / Self Care | Payer: Medicare Other | Attending: Gastroenterology | Admitting: Gastroenterology

## 2019-06-22 ENCOUNTER — Encounter (HOSPITAL_COMMUNITY)
Admission: RE | Disposition: A | Discharge status: Home / Self Care | Payer: Self-pay | Source: Home / Self Care | Attending: Gastroenterology

## 2019-06-22 ENCOUNTER — Encounter (HOSPITAL_COMMUNITY): Admission: RE | Payer: Self-pay | Source: Home / Self Care

## 2019-06-22 DIAGNOSIS — Z7951 Long term (current) use of inhaled steroids: Secondary | ICD-10-CM | POA: Diagnosis not present

## 2019-06-22 DIAGNOSIS — Z87891 Personal history of nicotine dependence: Secondary | ICD-10-CM | POA: Insufficient documentation

## 2019-06-22 DIAGNOSIS — E119 Type 2 diabetes mellitus without complications: Secondary | ICD-10-CM | POA: Insufficient documentation

## 2019-06-22 DIAGNOSIS — J449 Chronic obstructive pulmonary disease, unspecified: Secondary | ICD-10-CM | POA: Insufficient documentation

## 2019-06-22 DIAGNOSIS — R1310 Dysphagia, unspecified: Secondary | ICD-10-CM | POA: Diagnosis not present

## 2019-06-22 DIAGNOSIS — Z7982 Long term (current) use of aspirin: Secondary | ICD-10-CM | POA: Diagnosis not present

## 2019-06-22 DIAGNOSIS — E785 Hyperlipidemia, unspecified: Secondary | ICD-10-CM | POA: Insufficient documentation

## 2019-06-22 DIAGNOSIS — M1990 Unspecified osteoarthritis, unspecified site: Secondary | ICD-10-CM | POA: Diagnosis not present

## 2019-06-22 DIAGNOSIS — Z79899 Other long term (current) drug therapy: Secondary | ICD-10-CM | POA: Diagnosis not present

## 2019-06-22 DIAGNOSIS — Z7984 Long term (current) use of oral hypoglycemic drugs: Secondary | ICD-10-CM | POA: Insufficient documentation

## 2019-06-22 DIAGNOSIS — Z886 Allergy status to analgesic agent status: Secondary | ICD-10-CM | POA: Insufficient documentation

## 2019-06-22 DIAGNOSIS — K222 Esophageal obstruction: Secondary | ICD-10-CM | POA: Insufficient documentation

## 2019-06-22 DIAGNOSIS — Z888 Allergy status to other drugs, medicaments and biological substances status: Secondary | ICD-10-CM | POA: Insufficient documentation

## 2019-06-22 DIAGNOSIS — K219 Gastro-esophageal reflux disease without esophagitis: Secondary | ICD-10-CM | POA: Diagnosis not present

## 2019-06-22 HISTORY — PX: SAVORY DILATION: SHX5439

## 2019-06-22 HISTORY — PX: ESOPHAGOGASTRODUODENOSCOPY: SHX5428

## 2019-06-22 LAB — GLUCOSE, CAPILLARY: Glucose-Capillary: 175 mg/dL — ABNORMAL HIGH (ref 70–99)

## 2019-06-22 SURGERY — EGD (ESOPHAGOGASTRODUODENOSCOPY)
Anesthesia: Moderate Sedation

## 2019-06-22 MED ORDER — LIDOCAINE VISCOUS HCL 2 % MT SOLN
OROMUCOSAL | Status: AC
  Filled 2019-06-22: qty 15

## 2019-06-22 MED ORDER — STERILE WATER FOR IRRIGATION IR SOLN
Status: DC | PRN
  Administered 2019-06-22: 2.5 mL

## 2019-06-22 MED ORDER — MINERAL OIL PO OIL
TOPICAL_OIL | ORAL | Status: AC
  Filled 2019-06-22: qty 30

## 2019-06-22 MED ORDER — PROMETHAZINE HCL 25 MG/ML IJ SOLN
INTRAMUSCULAR | Status: AC
  Filled 2019-06-22: qty 1

## 2019-06-22 MED ORDER — LIDOCAINE VISCOUS HCL 2 % MT SOLN
OROMUCOSAL | Status: DC | PRN
  Administered 2019-06-22: 5 mL via OROMUCOSAL

## 2019-06-22 MED ORDER — MEPERIDINE HCL 100 MG/ML IJ SOLN
INTRAMUSCULAR | Status: DC | PRN
  Administered 2019-06-22: 50 mg via INTRAVENOUS
  Administered 2019-06-22: 25 mg via INTRAVENOUS

## 2019-06-22 MED ORDER — PROMETHAZINE HCL 25 MG/ML IJ SOLN: 12.5000 mg | Freq: Once | INTRAMUSCULAR | Status: AC

## 2019-06-22 MED ORDER — SODIUM CHLORIDE 0.9 % IV SOLN: INTRAVENOUS | Status: DC

## 2019-06-22 MED ORDER — PROMETHAZINE HCL 25 MG/ML IJ SOLN
INTRAMUSCULAR | Status: AC
  Administered 2019-06-22: 12.5 mg via INTRAVENOUS
  Filled 2019-06-22: qty 1

## 2019-06-22 MED ORDER — MIDAZOLAM HCL 5 MG/5ML IJ SOLN
INTRAMUSCULAR | Status: DC | PRN
  Administered 2019-06-22 (×2): 2 mg via INTRAVENOUS

## 2019-06-22 MED ORDER — MIDAZOLAM HCL 5 MG/5ML IJ SOLN
INTRAMUSCULAR | Status: AC
  Filled 2019-06-22: qty 10

## 2019-06-22 MED ORDER — MEPERIDINE HCL 100 MG/ML IJ SOLN
INTRAMUSCULAR | Status: AC
  Filled 2019-06-22: qty 2

## 2019-06-22 NOTE — Op Note (Signed)
[facility] Patient Name: Samuel Bird Procedure Date: 06/22/2019 8:01 AM MRN: [id number] Date of Birth: 1950/10/29 Attending MD: Barney Drain MD, MD CSN: [id number] Age: 69 Admit Type: Outpatient Procedure:                Upper GI endoscopy WITH ESOPHAGEAL DILATION Indications:              Dysphagia Providers:                Barney Drain MD, MD, Janeece Riggers, RN, Aram Candela Referring MD:             Waynard Reeds, PA Medicines:                Promethazine 12.5 mg IV, Meperidine 75 mg IV,                            Midazolam 4 mg IV Complications:            No immediate complications. Estimated Blood Loss:     Estimated blood loss: none. Procedure:                Pre-Anesthesia Assessment:                           - Prior to the procedure, a History and Physical                            was performed, and patient medications and                            allergies were reviewed. The patient's tolerance of                            previous anesthesia was also reviewed. The risks                            and benefits of the procedure and the sedation                            options and risks were discussed with the patient.                            All questions were answered, and informed consent                            was obtained. Prior Anticoagulants: The patient has                            taken no previous anticoagulant or antiplatelet                            agents except for aspirin. ASA Grade Assessment: II                            - A patient with mild systemic disease. After  reviewing the risks and benefits, the patient was                            deemed in satisfactory condition to undergo the                            procedure. After obtaining informed consent, the                            endoscope was passed under direct vision.                            Throughout the procedure, the patient's blood                      pressure, pulse, and oxygen saturations were                            monitored continuously. The [id number]                            scope was introduced through the mouth, and                            advanced to the second part of duodenum. The upper                            GI endoscopy was accomplished without difficulty.                            The patient tolerated the procedure well. Scope In: 9:34:40 AM Scope Out: 9:41:08 AM Total Procedure Duration: 0 hours 6 minutes 28 seconds  Findings:      One benign-appearing, intrinsic moderate (circumferential scarring or       stenosis; an endoscope may pass) stenosis was found. This stenosis       measured 1.4 cm (inner diameter). The stenosis was traversed. A       guidewire was placed and the scope was withdrawn. Dilation was performed       with a Savary dilator with mild resistance at 15 mm, 16 mm and 17 mm.       Estimated blood loss: none.      The entire examined stomach was normal.      The examined duodenum was normal. Impression:               - Benign-appearing esophageal stenosis. Dilated. Moderate Sedation:      Moderate (conscious) sedation was administered by the endoscopy nurse       and supervised by the endoscopist. The following parameters were       monitored: oxygen saturation, heart rate, blood pressure, and response       to care. Total physician intraservice time was 17 minutes. Recommendation:           - Patient has a contact number available for                            emergencies. The signs and symptoms  of potential                            delayed complications were discussed with the                            patient. Return to normal activities tomorrow.                            Written discharge instructions were provided to the                            patient.                           - Low fat diet.                           - Continue present  medications.                           - Await pathology results.                           - Return to GI office in 4 months. IF DYSPHAGIA                            PERSIST WILL NEED MANOMETRY/pH/IMPEDNACE. Procedure Code(s):        --- Professional ---                           [phone], Esophagogastroduodenoscopy, flexible,                            transoral; with insertion of guide wire followed by                            passage of dilator(s) through esophagus over guide                            wire                           G0500, Moderate sedation services provided by the                            same physician or other qualified health care                            professional performing a gastrointestinal                            endoscopic service that sedation supports,                            requiring the presence of an independent trained  observer to assist in the monitoring of the                            patient's level of consciousness and physiological                            status; initial 15 minutes of intra-service time;                            patient age 75 years or older (additional time may                            be reported with [id number], as appropriate) Diagnosis Code(s):        --- Professional ---                           K22.2, Esophageal obstruction                           R13.10, Dysphagia, unspecified CPT copyright 2019 American Medical Association. All rights reserved. The codes documented in this report are preliminary and upon coder review may  be revised to meet current compliance requirements. Barney Drain, MD Barney Drain MD, MD 06/22/2019 9:57:26 AM This report has been signed electronically. Number of Addenda: 0

## 2019-06-22 NOTE — H&P (Signed)
Primary Care Physician:  Waynard Reeds, PA Primary Gastroenterologist:  Dr. Oneida Alar  Pre-Procedure History & Physical: HPI:  Samuel Bird is a 69 y.o. male here for DYSPHAGIA.  Past Medical History:  Diagnosis Date  . Arthritis   . COPD (chronic obstructive pulmonary disease) ([place])   . Diabetes mellitus ([place])   . GERD (gastroesophageal reflux disease)   . Hyperlipidemia   . Kidney stone     Past Surgical History:  Procedure Laterality Date  . APPENDECTOMY    . COLONOSCOPY N/A 05/31/2015   Procedure: COLONOSCOPY;  Surgeon: Danie Binder, MD;  Location: [facility];  Service: Endoscopy;  Laterality: N/A;  830  . ELBOW BURSA SURGERY Left   . ESOPHAGOGASTRODUODENOSCOPY N/A 05/31/2015   Procedure: ESOPHAGOGASTRODUODENOSCOPY (EGD);  Surgeon: Danie Binder, MD;  Location: [facility];  Service: Endoscopy;  Laterality: N/A;  . NECK SURGERY    . UMBILICAL HERNIA REPAIR N/A 04/06/2019   Procedure: HERNIA REPAIR UMBILICAL ADULT WITH MESH;  Surgeon: Aviva Signs, MD;  Location: [facility] ORS;  Service: General;  Laterality: N/A;    Prior to Admission medications   Medication Sig Start Date End Date Taking? Authorizing Provider  acetaminophen (TYLENOL) 500 MG tablet Take 500 mg by mouth every 6 (six) hours as needed for moderate pain.    Yes [provider]  aspirin EC 81 MG tablet Take 81 mg by mouth daily.   Yes [provider]  Carboxymethylcellul-Glycerin (LUBRICATING EYE DROPS OP) Place 1 drop into both eyes daily as needed (dry eyes).   Yes [provider]  fluticasone (FLONASE) 50 MCG/ACT nasal spray Place 2 sprays into both nostrils daily as needed for allergies.  09/12/14  Yes [provider]  gabapentin (NEURONTIN) 300 MG capsule Take 300 mg by mouth 2 (two) times daily.  03/30/15  Yes [provider]  Lactase (LACTAID FAST ACT) 9000 units TABS Take 27,000 Units by mouth daily.   Yes [provider]  metFORMIN (GLUCOPHAGE-XR) 500 MG  24 hr tablet Take 1,000 mg by mouth in the morning and at bedtime.   Yes [provider]  omeprazole (PRILOSEC) 40 MG capsule Take 40 mg by mouth daily. 09/25/14  Yes [provider]  simvastatin (ZOCOR) 40 MG tablet Take 40 mg by mouth daily.   Yes [provider]  sitaGLIPtin (JANUVIA) 50 MG tablet Take 50 mg by mouth daily. 10/30/14  Yes [provider]  SYMBICORT 80-4.5 MCG/ACT inhaler Inhale 2 puffs into the lungs 2 (two) times a day. 07/13/18  Yes [provider]  Tiotropium Bromide Monohydrate (SPIRIVA RESPIMAT) 1.25 MCG/ACT AERS Inhale 1 puff into the lungs daily.   Yes [provider]  albuterol (PROVENTIL) (2.5 MG/3ML) 0.083% nebulizer solution Take 2.5 mg by nebulization every 6 (six) hours as needed for wheezing or shortness of breath.     [provider]  HYDROcodone-acetaminophen (NORCO) 5-325 MG tablet Take 1 tablet by mouth every 4 (four) hours as needed for moderate pain. Patient not taking: Reported on 06/14/2019 04/06/19   Aviva Signs, MD    Allergies as of 06/15/2019 - Review Complete 06/14/2019  Allergen Reaction Noted  . Glipizide Nausea And Vomiting 05/03/2015  . Naproxen Other (See Comments) 05/03/2015    Family History  Problem Relation Age of Onset  . Colon cancer Neg Hx   . Colon polyps Neg Hx     Social History   Socioeconomic History  . Marital status: Married    Spouse name: Not  on file  . Number of children: Not on file  . Years of education: Not on file  . Highest education level: Not on file  Occupational History  . Not on file  Tobacco Use  . Smoking status: Former Smoker    Packs/day: 0.50    Years: 43.00    Pack years: 21.50    Quit date: 05/30/2008    Years since quitting: 11.0  . Smokeless tobacco: Never Used  Substance and Sexual Activity  . Alcohol use: No    Alcohol/week: 0.0 standard drinks  . Drug use: No  . Sexual activity: Not on file  Other Topics Concern  . Not on  file  Social History Narrative  . Not on file   Social Determinants of Health   Financial Resource Strain:   . Difficulty of Paying Living Expenses:   Food Insecurity:   . Worried About Charity fundraiser in the Last Year:   . Arboriculturist in the Last Year:   Transportation Needs:   . Film/video editor (Medical):   Marland Kitchen Lack of Transportation (Non-Medical):   Physical Activity:   . Days of Exercise per Week:   . Minutes of Exercise per Session:   Stress:   . Feeling of Stress :   Social Connections:   . Frequency of Communication with Friends and Family:   . Frequency of Social Gatherings with Friends and Family:   . Attends Religious Services:   . Active Member of Clubs or Organizations:   . Attends Archivist Meetings:   Marland Kitchen Marital Status:   Intimate Partner Violence:   . Fear of Current or Ex-Partner:   . Emotionally Abused:   Marland Kitchen Physically Abused:   . Sexually Abused:     Review of Systems: See HPI, otherwise negative ROS   Physical Exam: BP 126/69   Pulse 86   Temp 98 F (36.7 C) (Oral)   Resp 11   Ht 5\' 8"  (1.727 m)   Wt 90.7 kg   SpO2 97%   BMI 30.41 kg/m  General:   Alert,  pleasant and cooperative in NAD Head:  Normocephalic and atraumatic. Neck:  Supple; Lungs:  Clear throughout to auscultation.    Heart:  Regular rate and rhythm. Abdomen:  Soft, nontender and nondistended. Normal bowel sounds, without guarding, and without rebound.   Neurologic:  Alert and  oriented x4;  grossly normal neurologically.  Impression/Plan:     DYSPHAGIA  PLAN:  EGD/DIL TODAY.  DISCUSSED PROCEDURE, BENEFITS, & RISKS: < 1% chance of medication reaction, bleeding, perforation, or ASPIRATION.

## 2019-06-22 NOTE — Progress Notes (Signed)
Call to friend Arlana Pouch at [phone] listed as ride for after procedure. Advised patient's friend that there was a delay in starting procedure.  "I will just have to be patient". Advised friend we will call when procedure is over. He verbalized understanding.

## 2019-06-22 NOTE — Discharge Instructions (Signed)
I dilated your esophagus. I DID NOT SEE A DEFINITE NARROWING IN YOUR esophagus. You have MILD gastritis.   CONTINUE YOUR WEIGHT LOSS EFFORTS.  DRINK WATER TO KEEP YOUR URINE LIGHT YELLOW.  FOLLOW A LOW FAT DIET. MEATS SHOULD BE BAKED, BROILED, OR BOILED. AVOID FRIED AND FAST FOOD.  AVOID REFLUX TRIGGERS. SEE INFO BELOW.   CONTINUE OMEPRAZOLE.  TAKE 30 MINUTES PRIOR TO YOUR FIRST MEAL.  FOLLOW UP IN 4 MOS.  UPPER ENDOSCOPY AFTER CARE Read the instructions outlined below and refer to this sheet in the next week. These discharge instructions provide you with general information on caring for yourself after you leave the hospital. While your treatment has been planned according to the most current medical practices available, unavoidable complications occasionally occur. If you have any problems or questions after discharge, call DR. Verdine Grenfell, [phone].  ACTIVITY  You may resume your regular activity, but move at a slower pace for the next 24 hours.   Take frequent rest periods for the next 24 hours.   Walking will help get rid of the air and reduce the bloated feeling in your belly (abdomen).   No driving for 24 hours (because of the medicine (anesthesia) used during the test).   You may shower.   Do not sign any important legal documents or operate any machinery for 24 hours (because of the anesthesia used during the test).    NUTRITION  Drink plenty of fluids.   You may resume your normal diet as instructed by your doctor.   Begin with a light meal and progress to your normal diet. Heavy or fried foods are harder to digest and may make you feel sick to your stomach (nauseated).   Avoid alcoholic beverages for 24 hours or as instructed.    MEDICATIONS  You may resume your normal medications.   WHAT YOU CAN EXPECT TODAY  Some feelings of bloating in the abdomen.   Passage of more gas than usual.    IF YOU HAD A BIOPSY TAKEN DURING THE UPPER ENDOSCOPY:  Eat a  soft diet IF YOU HAVE NAUSEA, BLOATING, ABDOMINAL PAIN, OR VOMITING.    FINDING OUT THE RESULTS OF YOUR TEST Not all test results are available during your visit. DR. Oneida Alar WILL CALL YOU WITHIN 14 DAYS OF YOUR PROCEDUE WITH YOUR RESULTS. Do not assume everything is normal if you have not heard from DR. Serin Thornell, CALL HER OFFICE AT [phone].  SEEK IMMEDIATE MEDICAL ATTENTION AND CALL THE OFFICE: [phone] IF:  You have more than a spotting of blood in your stool.   Your belly is swollen (abdominal distention).   You are nauseated or vomiting.   You have a temperature over 101F.   You have abdominal pain or discomfort that is severe or gets worse throughout the day.  DYSPHAGIA DYSPHAGIA can be caused by stomach acid backing up into the tube that carries food from the mouth down to the stomach (lower esophagus).  TREATMENT There are a number of medicines used to treat DYSPHAGIA including: Antacids.  Proton-pump inhibitors: PROTONIX TAGAMET/PEPCID    Lifestyle and home remedies to control REFLUX and regurgitation  You may eliminate or reduce the frequency of heartburn by making the following lifestyle changes:  . Control your weight. Being overweight is a major risk factor for heartburn and GERD. Excess pounds put pressure on your abdomen, pushing up your stomach and causing acid to back up into your esophagus.   . Eat smaller meals. 4 TO 6 MEALS  A DAY. This reduces pressure on the lower esophageal sphincter, helping to prevent the valve from opening and acid from washing back into your esophagus.   Dolphus Jenny your belt. Clothes that fit tightly around your waist put pressure on your abdomen and the lower esophageal sphincter.  .  . Eliminate heartburn triggers. Everyone has specific triggers. Common triggers such as fatty or fried foods, spicy food, tomato sauce, carbonated beverages, alcohol, chocolate, mint, garlic, onion, caffeine and nicotine may make heartburn worse.    Marland Kitchen Avoid stooping or bending. Tying your shoes is OK. Bending over for longer periods to weed your garden isn't, especially soon after eating.   . Don't lie down after a meal. Wait at least three to four hours after eating before going to bed, and don't lie down right after eating.   Alternative medicine . Several home remedies exist for treating GERD, but they provide only temporary relief. They include drinking baking soda (sodium bicarbonate) added to water or drinking other fluids such as baking soda mixed with cream of tartar and water. . Although these liquids create temporary relief by neutralizing, washing away or buffering acids, eventually they aggravate the situation by adding gas and fluid to your stomach, increasing pressure and causing more acid reflux. Further, adding more sodium to your diet may increase your blood pressure and add stress to your heart, and excessive bicarbonate ingestion can alter the acid-base balance in your body.

## 2019-10-24 ENCOUNTER — Other Ambulatory Visit: Payer: Self-pay

## 2019-10-24 ENCOUNTER — Encounter: Payer: Self-pay | Admitting: Gastroenterology

## 2019-10-24 ENCOUNTER — Encounter: Payer: Self-pay | Admitting: *Deleted

## 2019-10-24 ENCOUNTER — Other Ambulatory Visit (HOSPITAL_COMMUNITY)
Admission: RE | Admit: 2019-10-24 | Discharge: 2019-10-24 | Disposition: A | Discharge status: Home / Self Care | Payer: Medicare Other | Source: Ambulatory Visit | Attending: Gastroenterology | Admitting: Gastroenterology

## 2019-10-24 ENCOUNTER — Ambulatory Visit (INDEPENDENT_AMBULATORY_CARE_PROVIDER_SITE_OTHER): Payer: Medicare Other | Admitting: Gastroenterology

## 2019-10-24 VITALS — BP 129/72 | HR 89 | Temp 97.3°F | Ht 69.0 in | Wt 206.0 lb

## 2019-10-24 DIAGNOSIS — R1011 Right upper quadrant pain: Secondary | ICD-10-CM

## 2019-10-24 DIAGNOSIS — R1900 Intra-abdominal and pelvic swelling, mass and lump, unspecified site: Secondary | ICD-10-CM | POA: Diagnosis present

## 2019-10-24 DIAGNOSIS — R140 Abdominal distension (gaseous): Secondary | ICD-10-CM | POA: Insufficient documentation

## 2019-10-24 DIAGNOSIS — R1084 Generalized abdominal pain: Secondary | ICD-10-CM | POA: Diagnosis present

## 2019-10-24 DIAGNOSIS — R109 Unspecified abdominal pain: Secondary | ICD-10-CM | POA: Insufficient documentation

## 2019-10-24 LAB — COMPREHENSIVE METABOLIC PANEL
ALT: 17 U/L (ref 0–44)
AST: 15 U/L (ref 15–41)
Albumin: 4.4 g/dL (ref 3.5–5.0)
Alkaline Phosphatase: 67 U/L (ref 38–126)
Anion gap: 8 (ref 5–15)
BUN: 17 mg/dL (ref 8–23)
CO2: 23 mmol/L (ref 22–32)
Calcium: 9.2 mg/dL (ref 8.9–10.3)
Chloride: 106 mmol/L (ref 98–111)
Creatinine, Ser: 1.13 mg/dL (ref 0.61–1.24)
GFR calc Af Amer: >60 mL/min (ref >60)
GFR calc non Af Amer: >60 mL/min (ref >60)
Glucose, Bld: 108 mg/dL — ABNORMAL HIGH (ref 70–99)
Potassium: 4.3 mmol/L (ref 3.5–5.1)
Sodium: 137 mmol/L (ref 135–145)
Total Bilirubin: 0.4 mg/dL (ref 0.3–1.2)
Total Protein: 7.3 g/dL (ref 6.5–8.1)

## 2019-10-24 LAB — CBC WITH DIFFERENTIAL/PLATELET
Abs Immature Granulocytes: 0.02 10*3/uL (ref 0.00–0.07)
Basophils Absolute: 0 10*3/uL (ref 0.0–0.1)
Basophils Relative: 1 %
Eosinophils Absolute: 0.2 10*3/uL (ref 0.0–0.5)
Eosinophils Relative: 4 %
HCT: 40 % (ref 39.0–52.0)
Hemoglobin: 13 g/dL (ref 13.0–17.0)
Immature Granulocytes: 0 %
Lymphocytes Relative: 26 %
Lymphs Abs: 1.6 10*3/uL (ref 0.7–4.0)
MCH: 29.7 pg (ref 26.0–34.0)
MCHC: 32.5 g/dL (ref 30.0–36.0)
MCV: 91.3 fL (ref 80.0–100.0)
Monocytes Absolute: 0.7 10*3/uL (ref 0.1–1.0)
Monocytes Relative: 11 %
Neutro Abs: 3.5 10*3/uL (ref 1.7–7.7)
Neutrophils Relative %: 58 %
Platelets: 189 10*3/uL (ref 150–400)
RBC: 4.38 MIL/uL (ref 4.22–5.81)
RDW: 13.2 % (ref 11.5–15.5)
WBC: 6 10*3/uL (ref 4.0–10.5)
nRBC: 0 % (ref 0.0–0.2)

## 2019-10-24 LAB — LIPASE, BLOOD: Lipase: 40 U/L (ref 11–51)

## 2019-10-24 NOTE — Progress Notes (Signed)
Primary Care Physician: Waynard Reeds, PA  Primary Gastroenterologist:  Formerly Barney Drain, MD   Chief Complaint  Patient presents with  . Bloated  . pp f/u    swallowing ok    HPI: Samuel Bird is a 69 y.o. male here for follow up. Since last ov in 02/2019 patient completed EGD for dysphagia. EGD 06/2019: benign appearing esophageal stenosis s/p dilation. He had umbilical hernia repair in 03/2019.   Complains of postprandial bloating, abdominal swelling/tightness. Off/on for over a year. Feels like stomach swells and gets tight. Tried lactaid with dairy without relief. BM most days. At first has to push to get BM started. Stools might be alittle hard but then soft. Has to go back couple of times to complete. No melena, brbpr. No N/V. No dysphagia. Heartburn controlled.   Current Outpatient Medications  Medication Sig Dispense Refill  . acetaminophen (TYLENOL) 500 MG tablet Take 500 mg by mouth every 6 (six) hours as needed for moderate pain.     Marland Kitchen albuterol (PROVENTIL) (2.5 MG/3ML) 0.083% nebulizer solution Take 2.5 mg by nebulization every 6 (six) hours as needed for wheezing or shortness of breath.     Marland Kitchen aspirin EC 81 MG tablet Take 81 mg by mouth daily.    . Carboxymethylcellul-Glycerin (LUBRICATING EYE DROPS OP) Place 1 drop into both eyes daily as needed (dry eyes).    . fluticasone (FLONASE) 50 MCG/ACT nasal spray Place 2 sprays into both nostrils daily as needed for allergies.     Marland Kitchen gabapentin (NEURONTIN) 300 MG capsule Take 300 mg by mouth 2 (two) times daily.     . Lactase (LACTAID FAST ACT) 9000 units TABS Take 27,000 Units by mouth daily.    . metFORMIN (GLUCOPHAGE-XR) 500 MG 24 hr tablet Take 1,000 mg by mouth in the morning and at bedtime.    Marland Kitchen omeprazole (PRILOSEC) 40 MG capsule Take 40 mg by mouth daily.    . simvastatin (ZOCOR) 40 MG tablet Take 40 mg by mouth daily.    . sitaGLIPtin (JANUVIA) 50 MG tablet Take 50 mg by mouth daily.    . SYMBICORT 80-4.5  MCG/ACT inhaler Inhale 2 puffs into the lungs 2 (two) times a day.    . Tiotropium Bromide Monohydrate (SPIRIVA RESPIMAT) 1.25 MCG/ACT AERS Inhale 1 puff into the lungs daily.     No current facility-administered medications for this visit.    Allergies as of 10/24/2019 - Review Complete 10/24/2019  Allergen Reaction Noted  . Glipizide Nausea And Vomiting 05/03/2015  . Naproxen Other (See Comments) 05/03/2015    ROS:  General: Negative for anorexia, weight loss, fever, chills, fatigue, weakness. ENT: Negative for hoarseness, difficulty swallowing , nasal congestion. CV: Negative for chest pain, angina, palpitations, dyspnea on exertion, peripheral edema.  Respiratory: Negative for dyspnea at rest, dyspnea on exertion, cough, sputum, wheezing.  GI: See history of present illness. GU:  Negative for dysuria, hematuria, urinary incontinence, urinary frequency, nocturnal urination.  Endo: Negative for unusual weight change.    Physical Examination:   BP 129/72   Pulse 89   Temp (!) 97.3 F (36.3 C) (Temporal)   Ht 5\' 9"  (1.753 m)   Wt 206 lb (93.4 kg)   BMI 30.42 kg/m   General: Well-nourished, well-developed in no acute distress.  Eyes: No icterus. Mouth: masked. Lungs: Clear to auscultation bilaterally.  Heart: Regular rate and rhythm, no murmurs rubs or gallops.  Abdomen: Bowel sounds are normal, no hepatosplenomegaly or masses, no  abdominal bruits or hernia , no rebound or guarding.  Diffuse tenderness noted but increased in ruq area. Somewhat distended. Extremities: No lower extremity edema. No clubbing or deformities. Neuro: Alert and oriented x 4   Skin: Warm and dry, no jaundice.   Psych: Alert and cooperative, normal mood and affect.    Imaging Studies: No results found.    Impression/Plan:  Pleasant 69 y/o male presenting for follow up dysphagia. EGD/ED in 06/2019 with resolution of dysphagia. He continues to have abdominal bloating. States symptoms not better  with lactaid. On exam he is somewhat distended with diffuse tenderness but increased in ruq. It is unclear if symptoms are functional vs dietary vs other underlying issues. Obtain labs and CT A/P with contrast (to rule out obstructive process and evaluate for ruq tenderness, exclude malignancy).

## 2019-10-24 NOTE — Patient Instructions (Addendum)
1. Please have labs and CT scan done as discussed today. 2. If you consume dairy, take 2-3 lactaid pills to prevent bloating. May take up to 3 times daily with dairy.

## 2019-11-15 ENCOUNTER — Other Ambulatory Visit: Payer: Self-pay

## 2019-11-15 ENCOUNTER — Ambulatory Visit (HOSPITAL_COMMUNITY)
Admission: RE | Admit: 2019-11-15 | Discharge: 2019-11-15 | Disposition: A | Discharge status: Home / Self Care | Payer: Medicare Other | Source: Ambulatory Visit | Attending: Gastroenterology | Admitting: Gastroenterology

## 2019-11-15 DIAGNOSIS — R140 Abdominal distension (gaseous): Secondary | ICD-10-CM | POA: Insufficient documentation

## 2019-11-15 DIAGNOSIS — N200 Calculus of kidney: Secondary | ICD-10-CM | POA: Insufficient documentation

## 2019-11-15 MED ORDER — IOHEXOL 300 MG/ML  SOLN
100.0000 mL | Freq: Once | INTRAMUSCULAR | Status: AC | PRN
  Administered 2019-11-15: 100 mL via INTRAVENOUS

## 2019-12-05 ENCOUNTER — Telehealth: Payer: Self-pay | Admitting: *Deleted

## 2019-12-05 NOTE — Telephone Encounter (Signed)
Pt called and wanted to f/u to see if Neil Crouch, PA had spoken to Dr. Abbey Chatters yet about kidney stone being seen on CT scan.  Informed pt that Magda Paganini has gone for the day and will be back tomorrow.

## 2019-12-06 NOTE — Telephone Encounter (Signed)
See result note routed today to [place].

## 2019-12-06 NOTE — Telephone Encounter (Signed)
Noted  

## 2020-02-15 ENCOUNTER — Other Ambulatory Visit: Payer: Self-pay | Admitting: *Deleted

## 2020-02-15 ENCOUNTER — Ambulatory Visit (INDEPENDENT_AMBULATORY_CARE_PROVIDER_SITE_OTHER): Payer: Medicare Other | Admitting: Internal Medicine

## 2020-02-15 ENCOUNTER — Other Ambulatory Visit: Payer: Self-pay

## 2020-02-15 ENCOUNTER — Encounter: Payer: Self-pay | Admitting: Internal Medicine

## 2020-02-15 VITALS — BP 133/72 | HR 88 | Temp 97.3°F | Ht 68.0 in | Wt 204.3 lb

## 2020-02-15 DIAGNOSIS — R1013 Epigastric pain: Secondary | ICD-10-CM

## 2020-02-15 DIAGNOSIS — R1011 Right upper quadrant pain: Secondary | ICD-10-CM | POA: Diagnosis not present

## 2020-02-15 DIAGNOSIS — Z86010 Personal history of colonic polyps: Secondary | ICD-10-CM | POA: Diagnosis not present

## 2020-02-15 DIAGNOSIS — N281 Cyst of kidney, acquired: Secondary | ICD-10-CM

## 2020-02-15 NOTE — Progress Notes (Signed)
Referring Provider: Waynard Reeds, PA Primary Care Physician:  Waynard Reeds, PA Primary GI:  Dr. Abbey Chatters  Chief Complaint  Patient presents with  . Follow-up  . Abdominal Pain    near ribcage    HPI:   Samuel Bird is a 69 y.o. male who presents to the clinic today for follow-up visit.  He has a history of dysphagia which resolved with EGD April 2021 with dilation.  States he is doing well from the standpoint.  Takes omeprazole 40 mg daily.  Also has a history of postprandial bloating abdominal swelling.  States this is improved since starting Lactaid.  Also notes improving his diet, eating a lot of greens which seems to be helping.  He has been dealing with some intermittent right upper quadrant pain that radiates to his back.  He underwent CT abdomen pelvis on 11/15/2019 which showed partial thickening involving the duodenal bulb likely reflecting a prominent fold related to peristalsis especially given his recent negative EGD.  Patient states the pain comes and goes but is overall well-tolerated.    His CT did make mention of a 3 mm nonobstructing right renal calculus as well as bilateral renal cysts measuring up to 16 mm.  Patient is concerned about these for me today.  Past Medical History:  Diagnosis Date  . Arthritis   . COPD (chronic obstructive pulmonary disease) ([place])   . Diabetes mellitus ([place])   . GERD (gastroesophageal reflux disease)   . Hyperlipidemia   . Kidney stone     Past Surgical History:  Procedure Laterality Date  . APPENDECTOMY    . COLONOSCOPY N/A 05/31/2015   fields: tubular adenoma removed. diverticulosis with some narrowing involving diverticular opening. next tcs in 5-10 years.   . ELBOW BURSA SURGERY Left   . ESOPHAGOGASTRODUODENOSCOPY N/A 05/31/2015   Procedure: ESOPHAGOGASTRODUODENOSCOPY (EGD);  Surgeon: Danie Binder, MD;  Location: [facility];  Service: Endoscopy;  Laterality: N/A;  . ESOPHAGOGASTRODUODENOSCOPY N/A 06/22/2019   Fields: benign  appearing esophageal stenosis s/p dilation  . NECK SURGERY    . SAVORY DILATION N/A 06/22/2019   Procedure: SAVORY DILATION;  Surgeon: Danie Binder, MD;  Location: [facility];  Service: Endoscopy;  Laterality: N/A;  . UMBILICAL HERNIA REPAIR N/A 04/06/2019   Procedure: HERNIA REPAIR UMBILICAL ADULT WITH MESH;  Surgeon: Aviva Signs, MD;  Location: [facility];  Service: General;  Laterality: N/A;    Current Outpatient Medications  Medication Sig Dispense Refill  . acetaminophen (TYLENOL) 500 MG tablet Take 500 mg by mouth every 6 (six) hours as needed for moderate pain.     Marland Kitchen albuterol (PROVENTIL) (2.5 MG/3ML) 0.083% nebulizer solution Take 2.5 mg by nebulization every 6 (six) hours as needed for wheezing or shortness of breath.     Marland Kitchen aspirin EC 81 MG tablet Take 81 mg by mouth daily.    . Carboxymethylcellul-Glycerin (LUBRICATING EYE DROPS OP) Place 1 drop into both eyes daily as needed (dry eyes).    . fluticasone (FLONASE) 50 MCG/ACT nasal spray Place 2 sprays into both nostrils daily as needed for allergies.     Marland Kitchen gabapentin (NEURONTIN) 300 MG capsule Take 300 mg by mouth 2 (two) times daily.     . Lactase (LACTAID FAST ACT) 9000 units TABS Take 27,000 Units by mouth daily.    Marland Kitchen losartan (COZAAR) 25 MG tablet Take 25 mg by mouth daily.    . metFORMIN (GLUCOPHAGE-XR) 500 MG 24 hr tablet Take 1,000 mg by mouth  in the morning and at bedtime.    Marland Kitchen omeprazole (PRILOSEC) 40 MG capsule Take 40 mg by mouth daily.    . simvastatin (ZOCOR) 40 MG tablet Take 40 mg by mouth daily.    . sitaGLIPtin (JANUVIA) 50 MG tablet Take 50 mg by mouth daily.    . SYMBICORT 80-4.5 MCG/ACT inhaler Inhale 2 puffs into the lungs 2 (two) times a day.    . Tiotropium Bromide Monohydrate (SPIRIVA RESPIMAT) 1.25 MCG/ACT AERS Inhale 1 puff into the lungs daily.     No current facility-administered medications for this visit.    Allergies as of 02/15/2020 - Review Complete 02/15/2020  Allergen Reaction Noted  .  Glipizide Nausea And Vomiting 05/03/2015  . Naproxen Other (See Comments) 05/03/2015    Family History  Problem Relation Age of Onset  . Colon cancer Neg Hx   . Colon polyps Neg Hx     Social History   Socioeconomic History  . Marital status: Married    Spouse name: Not on file  . Number of children: Not on file  . Years of education: Not on file  . Highest education level: Not on file  Occupational History  . Not on file  Tobacco Use  . Smoking status: Former Smoker    Packs/day: 0.50    Years: 43.00    Pack years: 21.50    Quit date: 05/30/2008    Years since quitting: 11.7  . Smokeless tobacco: Never Used  Vaping Use  . Vaping Use: Never used  Substance and Sexual Activity  . Alcohol use: No    Alcohol/week: 0.0 standard drinks  . Drug use: No  . Sexual activity: Not on file  Other Topics Concern  . Not on file  Social History Narrative  . Not on file   Social Determinants of Health   Financial Resource Strain:   . Difficulty of Paying Living Expenses: Not on file  Food Insecurity:   . Worried About Charity fundraiser in the Last Year: Not on file  . Ran Out of Food in the Last Year: Not on file  Transportation Needs:   . Lack of Transportation (Medical): Not on file  . Lack of Transportation (Non-Medical): Not on file  Physical Activity:   . Days of Exercise per Week: Not on file  . Minutes of Exercise per Session: Not on file  Stress:   . Feeling of Stress : Not on file  Social Connections:   . Frequency of Communication with Friends and Family: Not on file  . Frequency of Social Gatherings with Friends and Family: Not on file  . Attends Religious Services: Not on file  . Active Member of Clubs or Organizations: Not on file  . Attends Archivist Meetings: Not on file  . Marital Status: Not on file    Subjective: Review of Systems  Constitutional: Negative for chills and fever.  HENT: Negative for congestion and hearing loss.   Eyes:  Negative for blurred vision and double vision.  Respiratory: Negative for cough and shortness of breath.   Cardiovascular: Negative for chest pain and palpitations.  Gastrointestinal: Positive for abdominal pain. Negative for blood in stool, constipation, diarrhea, heartburn, melena and vomiting.  Genitourinary: Negative for dysuria and urgency.  Musculoskeletal: Negative for joint pain and myalgias.  Skin: Negative for itching and rash.  Neurological: Negative for dizziness and headaches.  Psychiatric/Behavioral: Negative for depression. The patient is not nervous/anxious.      Objective: BP  133/72   Pulse 88   Temp (!) 97.3 F (36.3 C) (Temporal)   Ht 5\' 8"  (1.727 m)   Wt 204 lb 4.8 oz (92.7 kg)   BMI 31.06 kg/m  Physical Exam Constitutional:      Appearance: Normal appearance. He is obese.  HENT:     Head: Normocephalic and atraumatic.  Eyes:     Extraocular Movements: Extraocular movements intact.     Conjunctiva/sclera: Conjunctivae normal.  Cardiovascular:     Rate and Rhythm: Normal rate and regular rhythm.  Pulmonary:     Effort: Pulmonary effort is normal.     Breath sounds: Normal breath sounds.  Abdominal:     General: Bowel sounds are normal.     Palpations: Abdomen is soft.  Musculoskeletal:        General: Normal range of motion.     Cervical back: Normal range of motion and neck supple.  Skin:    General: Skin is warm.  Neurological:     General: No focal deficit present.     Mental Status: He is alert and oriented to person, place, and time.  Psychiatric:        Mood and Affect: Mood normal.        Behavior: Behavior normal.      Assessment: *Chronic reflux-well-controlled on omeprazole *Dysphagia-resolved status post dilation *Abdominal bloating-improved on Lactaid *History of adenomatous colon polyps *Renal cyst/stones  Plan: In regards to patient's chronic reflux, this appears well controlled on omeprazole.  We will continue.  Dysphagia  resolved status post esophageal dilation.  Abdominal bloating improved on Lactaid, will continue.  Congratulated patient on dietary improvements.  Patient will be due for surveillance colonoscopy in March/April 2022 due to history of adenomatous colon polyps.  Will schedule on follow-up visit.  Patient is concerned about renal cysts and small renal stone.  I discussed that this is likely noncontributory to his abdominal pain.  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  Patient would like referral to nephrology to discuss further.  Appreciate their help with this patient.  Patient follow-up in 4 months or sooner if needed.  02/15/2020 10:16 AM   Disclaimer: This note was dictated with voice recognition software. Similar sounding words can inadvertently be transcribed and may not be corrected upon review.

## 2020-02-15 NOTE — Patient Instructions (Addendum)
Continue on omeprazole for your chronic reflux and difficulty swallowing.  Continue on Lactaid for abdominal bloating.  We will plan on repeat colonoscopy in March/April 2022 due to history of colon polyps.  We will refer you to nephrology to discuss renal cysts/stones.  Follow-up in 4 months or sooner if needed.  At [facility] we value your feedback. You may receive a survey about your visit today. Please share your experience as we strive to create trusting relationships with our patients to provide genuine, compassionate, quality care.  We appreciate your understanding and patience as we review any laboratory studies, imaging, and other diagnostic tests that are ordered as we care for you. Our office policy is 5 business days for review of these results, and any emergent or urgent results are addressed in a timely manner for your best interest. If you do not hear from our office in 1 week, please contact us.   We also encourage the use of MyChart, which contains your medical information for your review as well. If you are not enrolled in this feature, an access code is on this after visit summary for your convenience. Thank you for allowing [place] to be involved in your care.  It was great to see you today!  I hope you have a great rest of your winter!!    Elon Alas. Abbey Chatters, D.O. Gastroenterology and Hepatology [facility]

## 2020-03-05 ENCOUNTER — Telehealth: Payer: Self-pay | Admitting: Internal Medicine

## 2020-03-05 NOTE — Telephone Encounter (Signed)
Per Proficient, he's scheduled for appt 03/28/20 at 2:00pm at [place].  Called pt, informed him of appt. Gave him address and phone# also.

## 2020-03-05 NOTE — Telephone Encounter (Signed)
Patient called asking about his referral to the kidney doctor.  Said that he has not heard from them yet about an appointment and its been a while

## 2020-03-28 ENCOUNTER — Other Ambulatory Visit: Payer: Self-pay | Admitting: Nephrology

## 2020-03-28 ENCOUNTER — Other Ambulatory Visit (HOSPITAL_COMMUNITY): Payer: Self-pay | Admitting: Nephrology

## 2020-03-28 DIAGNOSIS — E1129 Type 2 diabetes mellitus with other diabetic kidney complication: Secondary | ICD-10-CM

## 2020-03-28 DIAGNOSIS — E1122 Type 2 diabetes mellitus with diabetic chronic kidney disease: Secondary | ICD-10-CM

## 2020-03-28 DIAGNOSIS — Z79899 Other long term (current) drug therapy: Secondary | ICD-10-CM

## 2020-03-28 DIAGNOSIS — I129 Hypertensive chronic kidney disease with stage 1 through stage 4 chronic kidney disease, or unspecified chronic kidney disease: Secondary | ICD-10-CM

## 2020-04-04 ENCOUNTER — Other Ambulatory Visit: Payer: Self-pay

## 2020-04-04 ENCOUNTER — Ambulatory Visit (HOSPITAL_COMMUNITY)
Admission: RE | Admit: 2020-04-04 | Discharge: 2020-04-04 | Disposition: A | Discharge status: Home / Self Care | Payer: Medicare Other | Source: Ambulatory Visit | Attending: Nephrology | Admitting: Nephrology

## 2020-04-04 DIAGNOSIS — I129 Hypertensive chronic kidney disease with stage 1 through stage 4 chronic kidney disease, or unspecified chronic kidney disease: Secondary | ICD-10-CM | POA: Insufficient documentation

## 2020-04-04 DIAGNOSIS — E1122 Type 2 diabetes mellitus with diabetic chronic kidney disease: Secondary | ICD-10-CM | POA: Insufficient documentation

## 2020-04-04 DIAGNOSIS — R809 Proteinuria, unspecified: Secondary | ICD-10-CM | POA: Diagnosis present

## 2020-04-04 DIAGNOSIS — Z79899 Other long term (current) drug therapy: Secondary | ICD-10-CM | POA: Diagnosis present

## 2020-04-04 DIAGNOSIS — E1129 Type 2 diabetes mellitus with other diabetic kidney complication: Secondary | ICD-10-CM | POA: Diagnosis present

## 2020-06-18 ENCOUNTER — Encounter: Payer: Self-pay | Admitting: *Deleted

## 2020-06-18 ENCOUNTER — Other Ambulatory Visit: Payer: Self-pay

## 2020-06-18 ENCOUNTER — Encounter: Payer: Self-pay | Admitting: Gastroenterology

## 2020-06-18 ENCOUNTER — Ambulatory Visit (INDEPENDENT_AMBULATORY_CARE_PROVIDER_SITE_OTHER): Payer: Medicare Other | Admitting: Gastroenterology

## 2020-06-18 VITALS — BP 137/80 | HR 88 | Temp 97.3°F | Ht 68.0 in | Wt 205.4 lb

## 2020-06-18 DIAGNOSIS — D509 Iron deficiency anemia, unspecified: Secondary | ICD-10-CM | POA: Diagnosis not present

## 2020-06-18 DIAGNOSIS — Z86010 Personal history of colonic polyps: Secondary | ICD-10-CM | POA: Diagnosis not present

## 2020-06-18 DIAGNOSIS — K219 Gastro-esophageal reflux disease without esophagitis: Secondary | ICD-10-CM | POA: Diagnosis not present

## 2020-06-18 MED ORDER — FAMOTIDINE 20 MG PO TABS: 20.0000 mg | ORAL_TABLET | Freq: Two times a day (BID) | ORAL | 3 refills | Status: AC | PRN

## 2020-06-18 MED ORDER — CLENPIQ 10-3.5-12 MG-GM -GM/160ML PO SOLN: 1.0000 | Freq: Once | ORAL | 0 refills | Status: AC

## 2020-06-18 NOTE — Progress Notes (Signed)
Primary Care Physician:  Waynard Reeds, [place]  Primary Gastroenterologist:  Elon Alas. Abbey Chatters, DO   Chief Complaint  Patient presents with  . dyspepsia    PCP reduced Omeprazole    HPI:  Samuel Bird is a 70 y.o. male here for follow-up.  He was seen back in December.  History of chronic GERD, adenomatous colon polyps, dysphagia resolved status post esophageal dilation.  Patient is due for surveillance colonoscopy at this time.  Has seen nephrology back in January for renal insufficiency, eGFR 63.   Labs during that visit showed hemoglobin of 13, hematocrit 39.8, ferritin slightly low at 23, A1c 7.1, iron saturation slightly low at 17%, serum iron 60, TIBC 358, vitamin D 17, B12 441, hepatitis C antibody nonreactive, hepatitis B surface antigen nonreactive, hepatitis B surface antibody less than 5.  Nephrology recommending colonoscopy for iron deficiency.  Taking omeprazole on demand for past two weeks per PCP recommendations.  Patient states that insurance nurse called and recommended that he not take on a daily basis because he has been on it more than 8 weeks.  PCP recommended him trying taking omeprazole on a as needed basis only.  Started taking just couple of times per week for the past couple of weeks.  Also taking on-demand when he has reflux symptoms.  Last week he had 2 episodes of bad heartburn/upper abdominal pain, took omeprazole but it took hours for it to kick in.  No dysphagia.  BM regular. No melena, brbpr.   Current Outpatient Medications  Medication Sig Dispense Refill  . acetaminophen (TYLENOL) 500 MG tablet Take 500 mg by mouth every 6 (six) hours as needed for moderate pain.     Marland Kitchen albuterol (PROVENTIL) (2.5 MG/3ML) 0.083% nebulizer solution Take 2.5 mg by nebulization every 6 (six) hours as needed for wheezing or shortness of breath.     Marland Kitchen albuterol (VENTOLIN HFA) 108 (90 Base) MCG/ACT inhaler Inhale 2 puffs into the lungs as needed.    Marland Kitchen aspirin EC 81 MG tablet Take 81 mg  by mouth daily.    . Carboxymethylcellul-Glycerin (LUBRICATING EYE DROPS OP) Place 1 drop into both eyes daily as needed (dry eyes).    . Cholecalciferol 25 MCG (1000 UT) capsule Take 1 capsule by mouth daily.    . FEROSUL 325 (65 Fe) MG tablet Take 1 tablet by mouth 3 (three) times a week. Mon, Wed, Fri    . fluticasone (FLONASE) 50 MCG/ACT nasal spray Place 2 sprays into both nostrils daily as needed for allergies.     Marland Kitchen gabapentin (NEURONTIN) 300 MG capsule Take 300 mg by mouth 2 (two) times daily.     . Lactase (LACTAID FAST ACT) 9000 units TABS Take 27,000 Units by mouth daily.    Marland Kitchen losartan (COZAAR) 25 MG tablet Take 25 mg by mouth daily.    . metFORMIN (GLUCOPHAGE-XR) 500 MG 24 hr tablet Take 1,000 mg by mouth in the morning and at bedtime.    Marland Kitchen omeprazole (PRILOSEC) 40 MG capsule Take 40 mg by mouth as needed.    . simvastatin (ZOCOR) 40 MG tablet Take 40 mg by mouth daily.    . sitaGLIPtin (JANUVIA) 50 MG tablet Take 50 mg by mouth daily.    . SYMBICORT 80-4.5 MCG/ACT inhaler Inhale 2 puffs into the lungs 2 (two) times a day.    . tamsulosin (FLOMAX) 0.4 MG CAPS capsule Take 0.4 mg by mouth daily.    . vitamin B-12 (CYANOCOBALAMIN) 1000 MCG tablet Take 1,000  mcg by mouth daily.     No current facility-administered medications for this visit.    Allergies as of 06/18/2020 - Review Complete 06/18/2020  Allergen Reaction Noted  . Glipizide Nausea And Vomiting 05/03/2015  . Naproxen Other (See Comments) 05/03/2015    Past Medical History:  Diagnosis Date  . Arthritis   . COPD (chronic obstructive pulmonary disease) ([place])   . Diabetes mellitus ([place])   . GERD (gastroesophageal reflux disease)   . Hyperlipidemia   . Kidney stone     Past Surgical History:  Procedure Laterality Date  . APPENDECTOMY    . COLONOSCOPY N/A 05/31/2015   fields: tubular adenoma removed. diverticulosis with some narrowing involving diverticular opening. next tcs in 5-10 years.   . ELBOW BURSA SURGERY  Left   . ESOPHAGOGASTRODUODENOSCOPY N/A 05/31/2015   Procedure: ESOPHAGOGASTRODUODENOSCOPY (EGD);  Surgeon: Danie Binder, MD;  Location: [facility];  Service: Endoscopy;  Laterality: N/A;  . ESOPHAGOGASTRODUODENOSCOPY N/A 06/22/2019   Fields: benign appearing esophageal stenosis s/p dilation  . NECK SURGERY    . SAVORY DILATION N/A 06/22/2019   Procedure: SAVORY DILATION;  Surgeon: Danie Binder, MD;  Location: [facility];  Service: Endoscopy;  Laterality: N/A;  . UMBILICAL HERNIA REPAIR N/A 04/06/2019   Procedure: HERNIA REPAIR UMBILICAL ADULT WITH MESH;  Surgeon: Aviva Signs, MD;  Location: [facility];  Service: General;  Laterality: N/A;    Family History  Problem Relation Age of Onset  . Colon cancer Neg Hx   . Colon polyps Neg Hx     Social History   Socioeconomic History  . Marital status: Married    Spouse name: Not on file  . Number of children: Not on file  . Years of education: Not on file  . Highest education level: Not on file  Occupational History  . Not on file  Tobacco Use  . Smoking status: Former Smoker    Packs/day: 0.50    Years: 43.00    Pack years: 21.50    Quit date: 05/30/2008    Years since quitting: 12.0  . Smokeless tobacco: Never Used  Vaping Use  . Vaping Use: Never used  Substance and Sexual Activity  . Alcohol use: No    Alcohol/week: 0.0 standard drinks  . Drug use: No  . Sexual activity: Not on file  Other Topics Concern  . Not on file  Social History Narrative  . Not on file   Social Determinants of Health   Financial Resource Strain: Not on file  Food Insecurity: Not on file  Transportation Needs: Not on file  Physical Activity: Not on file  Stress: Not on file  Social Connections: Not on file  Intimate Partner Violence: Not on file      ROS:  General: Negative for anorexia, weight loss, fever, chills, fatigue, weakness. Eyes: Negative for vision changes.  ENT: Negative for hoarseness, difficulty swallowing , nasal  congestion. CV: Negative for chest pain, angina, palpitations, dyspnea on exertion, peripheral edema.  Respiratory: Negative for dyspnea at rest, dyspnea on exertion, cough, sputum, wheezing.  GI: See history of present illness. GU:  Negative for dysuria, hematuria, urinary incontinence, urinary frequency, nocturnal urination.  MS: Negative for joint pain, low back pain.  Derm: Negative for rash or itching.  Neuro: Negative for weakness, abnormal sensation, seizure, frequent headaches, memory loss, confusion.  Psych: Negative for anxiety, depression, suicidal ideation, hallucinations.  Endo: Negative for unusual weight change.  Heme: Negative for bruising or bleeding. Allergy:  Negative for rash or hives.    Physical Examination:  BP 137/80   Pulse 88   Temp (!) 97.3 F (36.3 C) (Temporal)   Ht $R'5\' 8"'Qy$  (1.727 m)   Wt 205 lb 6.4 oz (93.2 kg)   BMI 31.23 kg/m    General: Well-nourished, well-developed in no acute distress.  Head: Normocephalic, atraumatic.   Eyes: Conjunctiva pink, no icterus. Mouth: masked Neck: Supple without thyromegaly, masses, or lymphadenopathy.  Lungs: Clear to auscultation bilaterally.  Heart: Regular rate and rhythm, no murmurs rubs or gallops.  Abdomen: Bowel sounds are normal, nontender, nondistended, no hepatosplenomegaly or masses, no abdominal bruits or    hernia , no rebound or guarding.   Rectal: not performed Extremities: No lower extremity edema. No clubbing or deformities.  Neuro: Alert and oriented x 4 , grossly normal neurologically.  Skin: Warm and dry, no rash or jaundice.   Psych: Alert and cooperative, normal mood and affect.  Labs: Lab Results  Component Value Date   CREATININE 1.13 10/24/2019   BUN 17 10/24/2019   NA 137 10/24/2019   K 4.3 10/24/2019   CL 106 10/24/2019   CO2 23 10/24/2019   Lab Results  Component Value Date   ALT 17 10/24/2019   AST 15 10/24/2019   ALKPHOS 67 10/24/2019   BILITOT 0.4 10/24/2019   Lab  Results  Component Value Date   WBC 6.0 10/24/2019   HGB 13.0 10/24/2019   HCT 40.0 10/24/2019   MCV 91.3 10/24/2019   PLT 189 10/24/2019     Imaging Studies: No results found.  Assessment/plan:  Pleasant 70 year old English-speaking Hispanic male presenting for follow-up of chronic reflux, history of adenomatous colon polyps, recent diagnosis of iron deficiency anemia at the request of Dr. Theador Hawthorne.   Patient reports decline in management of his reflux symptoms recently.  Was doing well on omeprazole 40 mg daily.  Insurance nurse recommended he take as needed only, he discussed this with PCP who agree.  Since starting on-demand omeprazole 2 weeks ago, he has had a couple episodes of significant heartburn/upper abdominal discomfort.  He notes omeprazole on demand is not working.  He needs something that works faster.  Discussed at length with patient, previous history of esophageal stenosis requiring dilation indicative of complicated GERD.  He will likely require long-term PPI therapy that we can try an H2 blocker on demand for his reflux symptoms, limiting dosage to the lowest effective dose given his chronic renal insufficiency.  If symptoms of reflux become regular, more than 2-3 times per week, would advise daily PPI therapy.  Recent labs indicating mild iron deficiency anemia.  Patient has a history of adenomatous colon polyps and due for colonoscopy at this time.  Plan for colonoscopy with Dr. Abbey Chatters. ASA II.  I have discussed the risks, alternatives, benefits with regards to but not limited to the risk of reaction to medication, bleeding, infection, perforation and the patient is agreeable to proceed. Written consent to be obtained.

## 2020-06-18 NOTE — Patient Instructions (Addendum)
1. Colonoscopy as scheduled. See separate instructions.  2. Stop omeprazole for now. Try famotidine 20mg  up to twice daily as needed for reflux symptoms. Take smallest effective dose to control symptoms due to kidney disease. If you start needing medication for reflux more than twice a week, then we should consider going back to omeprazole daily.

## 2020-07-27 ENCOUNTER — Other Ambulatory Visit (HOSPITAL_COMMUNITY)
Admission: RE | Admit: 2020-07-27 | Discharge: 2020-07-27 | Disposition: A | Discharge status: Home / Self Care | Payer: Medicare Other | Source: Ambulatory Visit | Attending: Internal Medicine | Admitting: Internal Medicine

## 2020-07-27 ENCOUNTER — Other Ambulatory Visit: Payer: Self-pay

## 2020-07-27 DIAGNOSIS — Z01812 Encounter for preprocedural laboratory examination: Secondary | ICD-10-CM | POA: Diagnosis present

## 2020-07-27 DIAGNOSIS — Z20822 Contact with and (suspected) exposure to covid-19: Secondary | ICD-10-CM | POA: Insufficient documentation

## 2020-07-27 LAB — SARS CORONAVIRUS 2 (TAT 6-24 HRS): SARS Coronavirus 2: NEGATIVE

## 2020-07-30 ENCOUNTER — Ambulatory Visit (HOSPITAL_COMMUNITY): Payer: Medicare Other | Admitting: Certified Registered Nurse Anesthetist

## 2020-07-30 ENCOUNTER — Encounter (HOSPITAL_COMMUNITY): Payer: Self-pay

## 2020-07-30 ENCOUNTER — Other Ambulatory Visit: Payer: Self-pay

## 2020-07-30 ENCOUNTER — Ambulatory Visit (HOSPITAL_COMMUNITY)
Admission: RE | Admit: 2020-07-30 | Discharge: 2020-07-30 | Disposition: A | Discharge status: Home / Self Care | Payer: Medicare Other | Attending: Internal Medicine | Admitting: Internal Medicine

## 2020-07-30 ENCOUNTER — Encounter (HOSPITAL_COMMUNITY)
Admission: RE | Disposition: A | Discharge status: Home / Self Care | Payer: Self-pay | Source: Home / Self Care | Attending: Internal Medicine

## 2020-07-30 DIAGNOSIS — K5730 Diverticulosis of large intestine without perforation or abscess without bleeding: Secondary | ICD-10-CM | POA: Insufficient documentation

## 2020-07-30 DIAGNOSIS — K648 Other hemorrhoids: Secondary | ICD-10-CM | POA: Insufficient documentation

## 2020-07-30 DIAGNOSIS — Z86010 Personal history of colonic polyps: Secondary | ICD-10-CM

## 2020-07-30 DIAGNOSIS — D123 Benign neoplasm of transverse colon: Secondary | ICD-10-CM | POA: Diagnosis not present

## 2020-07-30 DIAGNOSIS — K635 Polyp of colon: Secondary | ICD-10-CM

## 2020-07-30 DIAGNOSIS — Z87891 Personal history of nicotine dependence: Secondary | ICD-10-CM | POA: Insufficient documentation

## 2020-07-30 DIAGNOSIS — Z7952 Long term (current) use of systemic steroids: Secondary | ICD-10-CM | POA: Diagnosis not present

## 2020-07-30 DIAGNOSIS — Z791 Long term (current) use of non-steroidal anti-inflammatories (NSAID): Secondary | ICD-10-CM | POA: Diagnosis not present

## 2020-07-30 DIAGNOSIS — J449 Chronic obstructive pulmonary disease, unspecified: Secondary | ICD-10-CM | POA: Insufficient documentation

## 2020-07-30 DIAGNOSIS — Z79899 Other long term (current) drug therapy: Secondary | ICD-10-CM | POA: Insufficient documentation

## 2020-07-30 DIAGNOSIS — K219 Gastro-esophageal reflux disease without esophagitis: Secondary | ICD-10-CM | POA: Diagnosis not present

## 2020-07-30 DIAGNOSIS — Z1211 Encounter for screening for malignant neoplasm of colon: Secondary | ICD-10-CM | POA: Insufficient documentation

## 2020-07-30 DIAGNOSIS — Z7984 Long term (current) use of oral hypoglycemic drugs: Secondary | ICD-10-CM | POA: Diagnosis not present

## 2020-07-30 DIAGNOSIS — Z888 Allergy status to other drugs, medicaments and biological substances status: Secondary | ICD-10-CM | POA: Diagnosis not present

## 2020-07-30 DIAGNOSIS — D125 Benign neoplasm of sigmoid colon: Secondary | ICD-10-CM | POA: Diagnosis not present

## 2020-07-30 DIAGNOSIS — E119 Type 2 diabetes mellitus without complications: Secondary | ICD-10-CM | POA: Insufficient documentation

## 2020-07-30 HISTORY — PX: POLYPECTOMY: SHX5525

## 2020-07-30 HISTORY — PX: COLONOSCOPY WITH PROPOFOL: SHX5780

## 2020-07-30 LAB — GLUCOSE, CAPILLARY: Glucose-Capillary: 144 mg/dL — ABNORMAL HIGH (ref 70–99)

## 2020-07-30 SURGERY — COLONOSCOPY WITH PROPOFOL
Anesthesia: General

## 2020-07-30 MED ORDER — PROPOFOL 10 MG/ML IV BOLUS
INTRAVENOUS | Status: DC | PRN
  Administered 2020-07-30: 100 mg via INTRAVENOUS
  Administered 2020-07-30: 30 mg via INTRAVENOUS

## 2020-07-30 MED ORDER — PROPOFOL 500 MG/50ML IV EMUL
INTRAVENOUS | Status: DC | PRN
  Administered 2020-07-30: 150 ug/kg/min via INTRAVENOUS

## 2020-07-30 MED ORDER — LACTATED RINGERS IV SOLN: INTRAVENOUS | Status: DC

## 2020-07-30 MED ORDER — LIDOCAINE HCL (CARDIAC) PF 100 MG/5ML IV SOSY
PREFILLED_SYRINGE | INTRAVENOUS | Status: DC | PRN
  Administered 2020-07-30: 50 mg via INTRAVENOUS

## 2020-07-30 NOTE — H&P (Signed)
Primary Care Physician:  Waynard Reeds, PA Primary Gastroenterologist:  Dr. Abbey Chatters  Pre-Procedure History & Physical: HPI:  Samuel Bird is a 70 y.o. male is here for a colonoscopy to be performed for history of adenomatous colon polyps. Last colonoscopy 2017 with one 22mm tubular adenoma removed. No family history of colon cancer.   Past Medical History:  Diagnosis Date  . Arthritis   . COPD (chronic obstructive pulmonary disease) ([place])   . Diabetes mellitus ([place])   . GERD (gastroesophageal reflux disease)   . Hyperlipidemia   . Kidney stone     Past Surgical History:  Procedure Laterality Date  . APPENDECTOMY    . COLONOSCOPY N/A 05/31/2015   fields: tubular adenoma removed. diverticulosis with some narrowing involving diverticular opening. next tcs in 5-10 years.   . ELBOW BURSA SURGERY Left   . ESOPHAGOGASTRODUODENOSCOPY N/A 05/31/2015   Procedure: ESOPHAGOGASTRODUODENOSCOPY (EGD);  Surgeon: Danie Binder, MD;  [facility];  Service: Endoscopy;  Laterality: N/A;  . ESOPHAGOGASTRODUODENOSCOPY N/A 06/22/2019   Fields: benign appearing esophageal stenosis s/p dilation  . NECK SURGERY    . SAVORY DILATION N/A 06/22/2019   Procedure: SAVORY DILATION;  Surgeon: Danie Binder, MD;  [facility];  Service: Endoscopy;  Laterality: N/A;  . UMBILICAL HERNIA REPAIR N/A 04/06/2019   Procedure: HERNIA REPAIR UMBILICAL ADULT WITH MESH;  Surgeon: Aviva Signs, MD;  Location: [facility];  Service: General;  Laterality: N/A;    Prior to Admission medications   Medication Sig Start Date End Date Taking? Authorizing Provider  acetaminophen (TYLENOL) 500 MG tablet Take 500 mg by mouth every 6 (six) hours as needed for moderate pain.    Yes [provider]  aspirin EC 81 MG tablet Take 81 mg by mouth daily.   Yes [provider]  Carboxymethylcellul-Glycerin (LUBRICATING EYE DROPS OP) Place 1 drop into both eyes daily as needed (dry eyes).   Yes [provider]  Cholecalciferol 25 MCG (1000 UT) capsule Take 1,000 Units by mouth daily. 04/20/20 04/20/21 Yes [provider]  FEROSUL 325 (65 Fe) MG tablet Take 325 mg by mouth every Monday, Wednesday, and Friday. 05/28/20  Yes [provider]  fluticasone (FLONASE) 50 MCG/ACT nasal spray Place 2 sprays into both nostrils daily. 09/12/14  Yes [provider]  gabapentin (NEURONTIN) 300 MG capsule Take 300 mg by mouth 2 (two) times daily.  03/30/15  Yes [provider]  lactase (LACTAID) 3000 units tablet Take 3,000-9,000 Units by mouth daily as needed (consuming dairy).   Yes [provider]  losartan (COZAAR) 25 MG tablet Take 25 mg by mouth daily.   Yes [provider]  metFORMIN (GLUCOPHAGE-XR) 500 MG 24 hr tablet Take 1,000 mg by mouth in the morning and at bedtime.   Yes [provider]  omeprazole (PRILOSEC) 40 MG capsule Take 40 mg by mouth daily.   Yes [provider]  simvastatin (ZOCOR) 40 MG tablet Take 40 mg by mouth every evening.   Yes [provider]  sitaGLIPtin (JANUVIA) 50 MG tablet Take 50 mg by mouth daily. 10/30/14  Yes [provider]  SYMBICORT 80-4.5 MCG/ACT inhaler Inhale 2 puffs into the lungs 2 (two) times a day. 07/13/18  Yes [provider]  tamsulosin (FLOMAX) 0.4 MG CAPS capsule Take 0.4 mg by mouth daily.   Yes [provider]  vitamin B-12 (CYANOCOBALAMIN) 1000 MCG tablet Take 1,000 mcg by mouth daily. 02/22/20  Yes [provider]  albuterol (PROVENTIL) (2.5 MG/3ML) 0.083% nebulizer solution Take 2.5 mg by nebulization every 6 (six) hours as needed for wheezing or shortness of breath.     [provider]  albuterol (VENTOLIN HFA) 108 (90 Base) MCG/ACT inhaler Inhale 2 puffs into the lungs every 6 (six) hours as needed for shortness of breath or wheezing. 05/24/20 05/24/21  [provider]  famotidine (PEPCID) 20 MG tablet Take 1 tablet (20 mg  total) by mouth 2 (two) times daily as needed for heartburn or indigestion. Take minimum dose needed to control symptoms. Patient not taking: No sig reported 06/18/20   Mahala Menghini, PA-C    Allergies as of 06/18/2020 - Review Complete 06/18/2020  Allergen Reaction Noted  . Glipizide Nausea And Vomiting 05/03/2015  . Naproxen Other (See Comments) 05/03/2015    Family History  Problem Relation Age of Onset  . Colon cancer Neg Hx   . Colon polyps Neg Hx     Social History   Socioeconomic History  . Marital status: Married    Spouse name: Not on file  . Number of children: Not on file  . Years of education: Not on file  . Highest education level: Not on file  Occupational History  . Not on file  Tobacco Use  . Smoking status: Former Smoker    Packs/day: 0.50    Years: 43.00    Pack years: 21.50    Quit date: 05/30/2008    Years since quitting: 12.1  . Smokeless tobacco: Never Used  Vaping Use  . Vaping Use: Never used  Substance and Sexual Activity  . Alcohol use: No    Alcohol/week: 0.0 standard drinks  . Drug use: No  . Sexual activity: Not on file  Other Topics Concern  . Not on file  Social History Narrative  . Not on file   Social Determinants of Health   Financial Resource Strain: Not on file  Food Insecurity: Not on file  Transportation Needs: Not on file  Physical Activity: Not on file  Stress: Not on file  Social Connections: Not on file  Intimate Partner Violence: Not on file    Review of Systems: See HPI, otherwise negative ROS  Physical Exam: Vital signs in last 24 hours: Temp:  [97.6 F (36.4 C)] 97.6 F (36.4 C) (05/16 0657) Resp:  [16] 16 (05/16 0657) BP: (141)/(73) 141/73 (05/16 0657) SpO2:  [95 %] 95 % (05/16 0657) Weight:  [93 kg] 93 kg (05/16 0657)   General:   Alert,  Well-developed, well-nourished, pleasant and cooperative in NAD Head:  Normocephalic and atraumatic. Eyes:  Sclera clear, no icterus.   Conjunctiva pink. Ears:   Normal auditory acuity. Nose:  No deformity, discharge,  or lesions. Mouth:  No deformity or lesions, dentition normal. Neck:  Supple; no masses or thyromegaly. Lungs:  Clear throughout to auscultation.   No wheezes, crackles, or rhonchi. No acute distress. Heart:  Regular rate and rhythm; no murmurs, clicks, rubs,  or gallops. Abdomen:  Soft, nontender and nondistended. No masses, hepatosplenomegaly or hernias noted. Normal bowel sounds, without guarding, and without rebound.   Msk:  Symmetrical without gross deformities. Normal posture. Extremities:  Without clubbing or edema. Neurologic:  Alert and  oriented x4;  grossly normal neurologically. Skin:  Intact without significant lesions or rashes. Cervical Nodes:  No significant cervical adenopathy. Psych:  Alert and cooperative. Normal mood and affect.  Impression/Plan: Samuel Bird is here for a colonoscopy to be performed for history  of adenomatous colon polyps.  The risks of the procedure including infection, bleed, or perforation as well as benefits, limitations, alternatives and imponderables have been reviewed with the patient. Questions have been answered. All parties agreeable.

## 2020-07-30 NOTE — Discharge Instructions (Addendum)
Colonoscopy Discharge Instructions  Read the instructions outlined below and refer to this sheet in the next few weeks. These discharge instructions provide you with general information on caring for yourself after you leave the hospital. Your doctor may also give you specific instructions. While your treatment has been planned according to the most current medical practices available, unavoidable complications occasionally occur.   ACTIVITY  You may resume your regular activity, but move at a slower pace for the next 24 hours.   Take frequent rest periods for the next 24 hours.   Walking will help get rid of the air and reduce the bloated feeling in your belly (abdomen).   No driving for 24 hours (because of the medicine (anesthesia) used during the test).    Do not sign any important legal documents or operate any machinery for 24 hours (because of the anesthesia used during the test).  NUTRITION  Drink plenty of fluids.   You may resume your normal diet as instructed by your doctor.   Begin with a light meal and progress to your normal diet. Heavy or fried foods are harder to digest and may make you feel sick to your stomach (nauseated).   Avoid alcoholic beverages for 24 hours or as instructed.  MEDICATIONS  You may resume your normal medications unless your doctor tells you otherwise.  WHAT YOU CAN EXPECT TODAY  Some feelings of bloating in the abdomen.   Passage of more gas than usual.   Spotting of blood in your stool or on the toilet paper.  IF YOU HAD POLYPS REMOVED DURING THE COLONOSCOPY:  No aspirin products for 7 days or as instructed.   No alcohol for 7 days or as instructed.   Eat a soft diet for the next 24 hours.  FINDING OUT THE RESULTS OF YOUR TEST Not all test results are available during your visit. If your test results are not back during the visit, make an appointment with your caregiver to find out the results. Do not assume everything is normal if  you have not heard from your caregiver or the medical facility. It is important for you to follow up on all of your test results.  SEEK IMMEDIATE MEDICAL ATTENTION IF:  You have more than a spotting of blood in your stool.   Your belly is swollen (abdominal distention).   You are nauseated or vomiting.   You have a temperature over 101.   You have abdominal pain or discomfort that is severe or gets worse throughout the day.   Your colonoscopy revealed 3 polyp(s) which I removed successfully. Await pathology results, my office will contact you. I recommend repeating colonoscopy in 5 years for surveillance purposes. You also have diverticulosis and internal hemorrhoids. I would recommend increasing fiber in your diet or adding OTC Benefiber/Metamucil. Be sure to drink at least 4 to 6 glasses of water daily. Follow-up with GI as needed.    I hope you have a great rest of your week!  Elon Alas. Abbey Chatters, D.O. Gastroenterology and Hepatology [facility]   Hemorrhoids Hemorrhoids are swollen veins in and around the rectum or anus. There are two types of hemorrhoids:  Internal hemorrhoids. These occur in the veins that are just inside the rectum. They may poke through to the outside and become irritated and painful.  External hemorrhoids. These occur in the veins that are outside the anus and can be felt as a painful swelling or hard lump near the anus. Most hemorrhoids  not cause serious problems, and they can be managed with home treatments such as diet and lifestyle changes. If home treatments do not help the symptoms, procedures can be done to shrink or remove the hemorrhoids. What are the causes? This condition is caused by increased pressure in the anal area. This pressure may result from various things, including:  Constipation.  Straining to have a bowel movement.  Diarrhea.  Pregnancy.  Obesity.  Sitting for long periods of time.  Heavy lifting  or other activity that causes you to strain.  Anal sex.  Riding a bike for a long period of time. What are the signs or symptoms? Symptoms of this condition include:  Pain.  Anal itching or irritation.  Rectal bleeding.  Leakage of stool (feces).  Anal swelling.  One or more lumps around the anus. How is this diagnosed? This condition can often be diagnosed through a visual exam. Other exams or tests may also be done, such as:  An exam that involves feeling the rectal area with a gloved hand (digital rectal exam).  An exam of the anal canal that is done using a small tube (anoscope).  A blood test, if you have lost a significant amount of blood.  A test to look inside the colon using a flexible tube with a camera on the end (sigmoidoscopy or colonoscopy). How is this treated? This condition can usually be treated at home. However, various procedures may be done if dietary changes, lifestyle changes, and other home treatments do not help your symptoms. These procedures can help make the hemorrhoids smaller or remove them completely. Some of these procedures involve surgery, and others do not. Common procedures include:  Rubber band ligation. Rubber bands are placed at the base of the hemorrhoids to cut off their blood supply.  Sclerotherapy. Medicine is injected into the hemorrhoids to shrink them.  Infrared coagulation. A type of light energy is used to get rid of the hemorrhoids.  Hemorrhoidectomy surgery. The hemorrhoids are surgically removed, and the veins that supply them are tied off.  Stapled hemorrhoidopexy surgery. The surgeon staples the base of the hemorrhoid to the rectal wall. Follow these instructions at home: Eating and drinking  Eat foods that have a lot of fiber in them, such as whole grains, beans, nuts, fruits, and vegetables.  Ask your health care provider about taking products that have added fiber (fiber supplements).  Reduce the amount of fat in  your diet. You can do this by eating low-fat dairy products, eating less red meat, and avoiding processed foods.  Drink enough fluid to keep your urine pale yellow.   Managing pain and swelling  Take warm sitz baths for 20 minutes, 3-4 times a day to ease pain and discomfort. You may do this in a bathtub or using a portable sitz bath that fits over the toilet.  If directed, apply ice to the affected area. Using ice packs between sitz baths may be helpful. ? Put ice in a plastic bag. ? Place a towel between your skin and the bag. ? Leave the ice on for 20 minutes, 2-3 times a day.   General instructions  Take over-the-counter and prescription medicines only as told by your health care provider.  Use medicated creams or suppositories as told.  Get regular exercise. Ask your health care provider how much and what kind of exercise is best for you. In general, you should do moderate exercise for at least 30 minutes on most days of the   week (150 minutes each week). This can include activities such as walking, biking, or yoga.  Go to the bathroom when you have the urge to have a bowel movement. Do not wait.  Avoid straining to have bowel movements.  Keep the anal area dry and clean. Use wet toilet paper or moist towelettes after a bowel movement.  Do not sit on the toilet for long periods of time. This increases blood pooling and pain.  Keep all follow-up visits as told by your health care provider. This is important. Contact a health care provider if you have:  Increasing pain and swelling that are not controlled by treatment or medicine.  Difficulty having a bowel movement, or you are unable to have a bowel movement.  Pain or inflammation outside the area of the hemorrhoids. Get help right away if you have:  Uncontrolled bleeding from your rectum. Summary  Hemorrhoids are swollen veins in and around the rectum or anus.  Most hemorrhoids can be managed with home treatments such as  diet and lifestyle changes.  Taking warm sitz baths can help ease pain and discomfort.  In severe cases, procedures or surgery can be done to shrink or remove the hemorrhoids. This information is not intended to replace advice given to you by your health care provider. Make sure you discuss any questions you have with your health care provider. Document Revised: 07/30/2018 Document Reviewed: 07/23/2017 Elsevier Patient Education  2021 Elsevier Inc.  Diverticulosis  Diverticulosis is a condition that develops when small pouches (diverticula) form in the wall of the large intestine (colon). The colon is where water is absorbed and stool (feces) is formed. The pouches form when the inside layer of the colon pushes through weak spots in the outer layers of the colon. You may have a few pouches or many of them. The pouches usually do not cause problems unless they become inflamed or infected. When this happens, the condition is called diverticulitis. What are the causes? The cause of this condition is not known. What increases the risk? The following factors may make you more likely to develop this condition:  Being older than age 60. Your risk for this condition increases with age. Diverticulosis is rare among people younger than age 30. By age 80, many people have it.  Eating a low-fiber diet.  Having frequent constipation.  Being overweight.  Not getting enough exercise.  Smoking.  Taking over-the-counter pain medicines, like aspirin and ibuprofen.  Having a family history of diverticulosis. What are the signs or symptoms? In most people, there are no symptoms of this condition. If you do have symptoms, they may include:  Bloating.  Cramps in the abdomen.  Constipation or diarrhea.  Pain in the lower left side of the abdomen. How is this diagnosed? Because diverticulosis usually has no symptoms, it is most often diagnosed during an exam for other colon problems. The  condition may be diagnosed by:  Using a flexible scope to examine the colon (colonoscopy).  Taking an X-ray of the colon after dye has been put into the colon (barium enema).  Having a CT scan. How is this treated? You may not need treatment for this condition. Your health care provider may recommend treatment to prevent problems. You may need treatment if you have symptoms or if you previously had diverticulitis. Treatment may include:  Eating a high-fiber diet.  Taking a fiber supplement.  Taking a live bacteria supplement (probiotic).  Taking medicine to relax your colon.   Follow   Follow these instructions at home: Medicines  Take over-the-counter and prescription medicines only as told by your health care provider.  If told by your health care provider, take a fiber supplement or probiotic. Constipation prevention Your condition may cause constipation. To prevent or treat constipation, you may need to:  Drink enough fluid to keep your urine pale yellow.  Take over-the-counter or prescription medicines.  Eat foods that are high in fiber, such as beans, whole grains, and fresh fruits and vegetables.  Limit foods that are high in fat and processed sugars, such as fried or sweet foods.   General instructions  Try not to strain when you have a bowel movement.  Keep all follow-up visits as told by your health care provider. This is important. Contact a health care provider if you:  Have pain in your abdomen.  Have bloating.  Have cramps.  Have not had a bowel movement in 3 days. Get help right away if:  Your pain gets worse.  Your bloating becomes very bad.  You have a fever or chills, and your symptoms suddenly get worse.  You vomit.  You have bowel movements that are bloody or black.  You have bleeding from your rectum. Summary  Diverticulosis is a condition that develops when small pouches (diverticula) form in the wall of the large intestine (colon).  You  may have a few pouches or many of them.  This condition is most often diagnosed during an exam for other colon problems.  Treatment may include increasing the fiber in your diet, taking supplements, or taking medicines. This information is not intended to replace advice given to you by your health care provider. Make sure you discuss any questions you have with your health care provider. Document Revised: 09/30/2018 Document Reviewed: 09/30/2018 Elsevier Patient Education  [place].  Colon Polyps  Colon polyps are tissue growths inside the colon, which is part of the large intestine. They are one of the types of polyps that can grow in the body. A polyp may be a round bump or a mushroom-shaped growth. You could have one polyp or more than one. Most colon polyps are noncancerous (benign). However, some colon polyps can become cancerous over time. Finding and removing the polyps early can help prevent this. What are the causes? The exact cause of colon polyps is not known. What increases the risk? The following factors may make you more likely to develop this condition:  Having a family history of colorectal cancer or colon polyps.  Being older than 69 years of age.  Being younger than 69 years of age and having a significant family history of colorectal cancer or colon polyps or a genetic condition that puts you at higher risk of getting colon polyps.  Having inflammatory bowel disease, such as ulcerative colitis or Crohn's disease.  Having certain conditions passed from parent to child (hereditary conditions), such as: ? Familial adenomatous polyposis (FAP). ? Lynch syndrome. ? Turcot syndrome. ? Peutz-Jeghers syndrome. ? MUTYH-associated polyposis (MAP).  Being overweight.  Certain lifestyle factors. These include smoking cigarettes, drinking too much alcohol, not getting enough exercise, and eating a diet that is high in fat and red meat and low in fiber.  Having had  childhood cancer that was treated with radiation of the abdomen. What are the signs or symptoms? Many times, there are no symptoms. If you have symptoms, they may include:  Blood coming from the rectum during a bowel movement.  Blood in the stool (feces).  The blood may be bright red or very dark in color.  Pain in the abdomen.  A change in bowel habits, such as constipation or diarrhea. How is this diagnosed? This condition is diagnosed with a colonoscopy. This is a procedure in which a lighted, flexible scope is inserted into the opening between the buttocks (anus) and then passed into the colon to examine the area. Polyps are sometimes found when a colonoscopy is done as part of routine cancer screening tests. How is this treated? This condition is treated by removing any polyps that are found. Most polyps can be removed during a colonoscopy. Those polyps will then be tested for cancer. Additional treatment may be needed depending on the results of testing. Follow these instructions at home: Eating and drinking  Eat foods that are high in fiber, such as fruits, vegetables, and whole grains.  Eat foods that are high in calcium and vitamin D, such as milk, cheese, yogurt, eggs, liver, fish, and broccoli.  Limit foods that are high in fat, such as fried foods and desserts.  Limit the amount of red meat, precooked or cured meat, or other processed meat that you eat, such as hot dogs, sausages, bacon, or meat loaves.  Limit sugary drinks.   Lifestyle  Maintain a healthy weight, or lose weight if recommended by your health care provider.  Exercise every day or as told by your health care provider.  Do not use any products that contain nicotine or tobacco, such as cigarettes, e-cigarettes, and chewing tobacco. If you need help quitting, ask your health care provider.  Do not drink alcohol if: ? Your health care provider tells you not to drink. ? You are pregnant, may be pregnant, or  are planning to become pregnant.  If you drink alcohol: ? Limit how much you use to:  0-1 drink a day for women.  0-2 drinks a day for men. ? Know how much alcohol is in your drink. In the [place] one drink equals one 12 oz bottle of beer (355 mL), one 5 oz glass of wine (148 mL), or one 1 oz glass of hard liquor (44 mL). General instructions  Take over-the-counter and prescription medicines only as told by your health care provider.  Keep all follow-up visits. This is important. This includes having regularly scheduled colonoscopies. Talk to your health care provider about when you need a colonoscopy. Contact a health care provider if:  You have new or worsening bleeding during a bowel movement.  You have new or increased blood in your stool.  You have a change in bowel habits.  You lose weight for no known reason. Summary  Colon polyps are tissue growths inside the colon, which is part of the large intestine. They are one type of polyp that can grow in the body.  Most colon polyps are noncancerous (benign), but some can become cancerous over time.  This condition is diagnosed with a colonoscopy.  This condition is treated by removing any polyps that are found. Most polyps can be removed during a colonoscopy. This information is not intended to replace advice given to you by your health care provider. Make sure you discuss any questions you have with your health care provider. Document Revised: 06/22/2019 Document Reviewed: 06/22/2019 Elsevier Patient Education  2021 [employer].

## 2020-07-30 NOTE — Transfer of Care (Signed)
Immediate Anesthesia Transfer of Care Note  Patient: Samuel Bird  Procedure(s) Performed: COLONOSCOPY WITH PROPOFOL (N/A ) POLYPECTOMY  Patient Location: PACU  Anesthesia Type:General  Level of Consciousness: awake, alert  and oriented  Airway & Oxygen Therapy: Patient Spontanous Breathing  Post-op Assessment: Report given to RN and Post -op Vital signs reviewed and stable  Post vital signs: Reviewed and stable  Last Vitals:  Vitals Value Taken Time  BP    Temp    Pulse 92   Resp 12   SpO2 96     Last Pain:  Vitals:   07/30/20 0821  TempSrc:   PainSc: 0-No pain      Patients Stated Pain Goal: 6 (36/46/80 [id number])  Complications: No complications documented.

## 2020-07-30 NOTE — Anesthesia Preprocedure Evaluation (Signed)
Anesthesia Evaluation    Airway Mallampati: II  TM Distance: >3 FB Neck ROM: Full    Dental  (+) Dental Advisory Given, Caps   Pulmonary COPD,  COPD inhaler, former smoker,    Pulmonary exam normal        Cardiovascular Normal cardiovascular exam     Neuro/Psych    GI/Hepatic GERD  Controlled,  Endo/Other  diabetes, Well Controlled, Type 2, Oral Hypoglycemic Agents  Renal/GU      Musculoskeletal   Abdominal Normal abdominal exam  (+)   Peds  Hematology   Anesthesia Other Findings   Reproductive/Obstetrics                             Anesthesia Physical Anesthesia Plan  ASA: II  Anesthesia Plan: General   Post-op Pain Management:    Induction: Intravenous  PONV Risk Score and Plan:   Airway Management Planned: Nasal Cannula and Natural Airway  Additional Equipment:   Intra-op Plan:   Post-operative Plan:   Informed Consent:     Dental advisory given  Plan Discussed with:   Anesthesia Plan Comments:         Anesthesia Quick Evaluation

## 2020-07-30 NOTE — Op Note (Signed)
[facility] Patient Name: Samuel Bird Procedure Date: 07/30/2020 8:09 AM MRN: [id number] Date of Birth: 30-Aug-1950 Attending MD: Elon Alas. Abbey Chatters DO CSN: [id number] Age: 70 Admit Type: Outpatient Procedure:                Colonoscopy Indications:              Surveillance: Personal history of adenomatous                            polyps on last colonoscopy 5 years ago Providers:                Elon Alas. Abbey Chatters, DO, Gwenlyn Fudge, RN, Dereck Leep, Technician Referring MD:              Medicines:                See the Anesthesia note for documentation of the                            administered medications Complications:            No immediate complications. Estimated Blood Loss:     Estimated blood loss was minimal. Procedure:                Pre-Anesthesia Assessment:                           - The anesthesia plan was to use monitored                            anesthesia care (MAC).                           After obtaining informed consent, the colonoscope                            was passed under direct vision. Throughout the                            procedure, the patient's blood pressure, pulse, and                            oxygen saturations were monitored continuously. The                            [id number]-[id number] ([id number]) scope was introduced through                            the anus and advanced to the the cecum, identified                            by appendiceal orifice and ileocecal valve. The                            colonoscopy was performed  without difficulty. The                            patient tolerated the procedure well. The quality                            of the bowel preparation was evaluated using the                            BBPS [facility] Bowel Preparation Scale) with scores                            of: Right Colon = 2 (minor amount of residual                            staining, small  fragments of stool and/or opaque                            liquid, but mucosa seen well), Transverse Colon = 3                            (entire mucosa seen well with no residual staining,                            small fragments of stool or opaque liquid) and Left                            Colon = 3 (entire mucosa seen well with no residual                            staining, small fragments of stool or opaque                            liquid). The total BBPS score equals 8. The quality                            of the bowel preparation was good. Scope In: 8:25:17 AM Scope Out: 8:50:01 AM Scope Withdrawal Time: 0 hours 12 minutes 27 seconds  Total Procedure Duration: 0 hours 24 minutes 44 seconds  Findings:      The perianal and digital rectal examinations were normal.      Non-bleeding internal hemorrhoids were found during endoscopy.      Multiple small-mouthed diverticula were found in the sigmoid colon and       descending colon.      Two sessile polyps were found in the transverse colon. The polyps were 5       to 8 mm in size. These polyps were removed with a cold snare. Resection       and retrieval were complete.      A 5 mm polyp was found in the sigmoid colon. The polyp was       semi-pedunculated. The polyp was removed with a cold snare. Resection       and retrieval were complete. Impression:               -  Non-bleeding internal hemorrhoids.                           - Diverticulosis in the sigmoid colon and in the                            descending colon.                           - Two 5 to 8 mm polyps in the transverse colon,                            removed with a cold snare. Resected and retrieved.                           - One 5 mm polyp in the sigmoid colon, removed with                            a cold snare. Resected and retrieved. Moderate Sedation:      Per Anesthesia Care Recommendation:           - Patient has a contact number available for                             emergencies. The signs and symptoms of potential                            delayed complications were discussed with the                            patient. Return to normal activities tomorrow.                            Written discharge instructions were provided to the                            patient.                           - Resume previous diet.                           - Continue present medications.                           - Await pathology results.                           - Repeat colonoscopy in 5 years for surveillance.                           - Return to GI clinic PRN. Procedure Code(s):        --- Professional ---                           [phone], Colonoscopy, flexible; with removal  of                            tumor(s), polyp(s), or other lesion(s) by snare                            technique Diagnosis Code(s):        --- Professional ---                           K63.5, Polyp of colon                           Z86.010, Personal history of colonic polyps                           K64.8, Other hemorrhoids                           K57.30, Diverticulosis of large intestine without                            perforation or abscess without bleeding CPT copyright 2019 American Medical Association. All rights reserved. The codes documented in this report are preliminary and upon coder review may  be revised to meet current compliance requirements. Elon Alas. Abbey Chatters, DO [place] Abbey Chatters, DO 07/30/2020 8:53:36 AM This report has been signed electronically. Number of Addenda: 0

## 2020-07-30 NOTE — Anesthesia Postprocedure Evaluation (Signed)
Anesthesia Post Note  Patient: Nathanuel Cabreja  Procedure(s) Performed: COLONOSCOPY WITH PROPOFOL (N/A ) POLYPECTOMY  Patient location during evaluation: Phase II Anesthesia Type: General Level of consciousness: awake Pain management: pain level controlled Vital Signs Assessment: post-procedure vital signs reviewed and stable Respiratory status: spontaneous breathing and respiratory function stable Cardiovascular status: blood pressure returned to baseline and stable Postop Assessment: no headache and no apparent nausea or vomiting Anesthetic complications: no Comments: Late entry   No complications documented.   Last Vitals:  Vitals:   07/30/20 0657 07/30/20 0853  BP: (!) 141/73 113/65  Resp: 16 14  Temp: 36.4 C 36.4 C  SpO2: 95% 97%    Last Pain:  Vitals:   07/30/20 0853  TempSrc: Oral  PainSc: 0-No pain                 Louann Sjogren

## 2020-08-01 LAB — SURGICAL PATHOLOGY

## 2020-08-03 ENCOUNTER — Encounter (HOSPITAL_COMMUNITY): Payer: Self-pay | Admitting: Internal Medicine

## 2020-08-24 NOTE — Progress Notes (Signed)
Recall placed

## 2020-12-08 ENCOUNTER — Encounter (HOSPITAL_COMMUNITY): Payer: Self-pay | Admitting: *Deleted

## 2020-12-08 ENCOUNTER — Emergency Department (HOSPITAL_COMMUNITY): Payer: Medicare Other

## 2020-12-08 ENCOUNTER — Emergency Department (HOSPITAL_COMMUNITY)
Admission: EM | Admit: 2020-12-08 | Discharge: 2020-12-08 | Disposition: A | Discharge status: Home / Self Care | Payer: Medicare Other | Attending: Emergency Medicine | Admitting: Emergency Medicine

## 2020-12-08 ENCOUNTER — Other Ambulatory Visit: Payer: Self-pay

## 2020-12-08 DIAGNOSIS — Z87891 Personal history of nicotine dependence: Secondary | ICD-10-CM | POA: Insufficient documentation

## 2020-12-08 DIAGNOSIS — J449 Chronic obstructive pulmonary disease, unspecified: Secondary | ICD-10-CM | POA: Diagnosis not present

## 2020-12-08 DIAGNOSIS — E119 Type 2 diabetes mellitus without complications: Secondary | ICD-10-CM | POA: Diagnosis not present

## 2020-12-08 DIAGNOSIS — M542 Cervicalgia: Secondary | ICD-10-CM | POA: Diagnosis not present

## 2020-12-08 DIAGNOSIS — Z7982 Long term (current) use of aspirin: Secondary | ICD-10-CM | POA: Diagnosis not present

## 2020-12-08 DIAGNOSIS — Z7984 Long term (current) use of oral hypoglycemic drugs: Secondary | ICD-10-CM | POA: Diagnosis not present

## 2020-12-08 MED ORDER — METHOCARBAMOL 500 MG PO TABS: 500.0000 mg | ORAL_TABLET | Freq: Once | ORAL | Status: DC

## 2020-12-08 MED ORDER — METHOCARBAMOL 500 MG PO TABS: 500.0000 mg | ORAL_TABLET | Freq: Four times a day (QID) | ORAL | 0 refills | Status: AC

## 2020-12-08 MED ORDER — HYDROCODONE-ACETAMINOPHEN 5-325 MG PO TABS: 1.0000 | ORAL_TABLET | Freq: Once | ORAL | Status: DC

## 2020-12-08 MED ORDER — HYDROCODONE-ACETAMINOPHEN 5-325 MG PO TABS: 1.0000 | ORAL_TABLET | ORAL | 0 refills | Status: AC | PRN

## 2020-12-08 NOTE — Discharge Instructions (Addendum)
See your Physician next week for recheck

## 2020-12-08 NOTE — ED Triage Notes (Signed)
Pain in back of neck for the past 3 days, history of neck surgery 20 years ago

## 2020-12-08 NOTE — ED Provider Notes (Signed)
[facility] EMERGENCY DEPARTMENT Provider Note   CSN: [id number] Arrival date & time: 12/08/20  1834     History Chief Complaint  Patient presents with   Neck Pain    Samuel Bird is a 70 y.o. male.  The history is provided by the patient.  Neck Pain Pain location:  Occipital region Quality:  Aching Pain radiates to:  Does not radiate Pain severity:  Severe Pain is:  Worse during the day Onset quality:  Gradual Timing:  Constant Progression:  Worsening Chronicity:  New Relieved by:  Nothing Worsened by:  Nothing Ineffective treatments:  None tried Associated symptoms: no headaches   Risk factors: no recurrent falls   Pt has a history of neck pain.  Pt reports he is suppose to have an mri.  Pt reports increased pain for 3 days.  Pt is concerned about his hardware.     Past Medical History:  Diagnosis Date   Arthritis    COPD (chronic obstructive pulmonary disease) ([place])    Diabetes mellitus ([place])    GERD (gastroesophageal reflux disease)    Hyperlipidemia    Kidney stone     Patient Active Problem List   Diagnosis Date Noted   IDA (iron deficiency anemia) 06/18/2020   GERD (gastroesophageal reflux disease)    Bilateral renal cysts 02/15/2020   Abdominal swelling 10/24/2019   Abdominal pain 10/24/2019   RUQ pain 15/17/6160   Umbilical hernia without obstruction and without gangrene    Acute periumbilical pain 73/71/0626   Dysphagia 02/23/2019   Need for hepatitis C screening test 08/19/2018   History of colonic polyps    Nausea without vomiting    Colon polyps 05/03/2015   Dyspepsia 05/03/2015    Past Surgical History:  Procedure Laterality Date   APPENDECTOMY     COLONOSCOPY N/A 05/31/2015   fields: tubular adenoma removed. diverticulosis with some narrowing involving diverticular opening. next tcs in 5-10 years.    COLONOSCOPY WITH PROPOFOL N/A 07/30/2020   Procedure: COLONOSCOPY WITH PROPOFOL;  Surgeon: Eloise Harman, DO;  Location: [facility];  Service: Endoscopy;  Laterality: N/A;  early am, diabetic   ELBOW BURSA SURGERY Left    ESOPHAGOGASTRODUODENOSCOPY N/A 05/31/2015   Procedure: ESOPHAGOGASTRODUODENOSCOPY (EGD);  Surgeon: Danie Binder, MD;  Location: [facility];  Service: Endoscopy;  Laterality: N/A;   ESOPHAGOGASTRODUODENOSCOPY N/A 06/22/2019   Fields: benign appearing esophageal stenosis s/p dilation   NECK SURGERY     POLYPECTOMY  07/30/2020   Procedure: POLYPECTOMY;  Surgeon: Eloise Harman, DO;  Location: [facility];  Service: Endoscopy;;   SAVORY DILATION N/A 06/22/2019   Procedure: SAVORY DILATION;  Surgeon: Danie Binder, MD;  Location: [facility];  Service: Endoscopy;  Laterality: N/A;   UMBILICAL HERNIA REPAIR N/A 04/06/2019   Procedure: HERNIA REPAIR UMBILICAL ADULT WITH MESH;  Surgeon: Aviva Signs, MD;  Location: [facility] ORS;  Service: General;  Laterality: N/A;       Family History  Problem Relation Age of Onset   Colon cancer Neg Hx    Colon polyps Neg Hx     Social History   Tobacco Use   Smoking status: Former    Packs/day: 0.50    Years: 43.00    Pack years: 21.50    Types: Cigarettes    Quit date: 05/30/2008    Years since quitting: 12.5   Smokeless tobacco: Never  Vaping Use   Vaping Use: Never used  Substance Use Topics   Alcohol  use: No    Alcohol/week: 0.0 standard drinks   Drug use: No    Home Medications Prior to Admission medications   Medication Sig Start Date End Date Taking? Authorizing Provider  HYDROcodone-acetaminophen (NORCO/VICODIN) 5-325 MG tablet Take 1 tablet by mouth every 4 (four) hours as needed for moderate pain. 12/08/20 12/08/21 Yes Fransico Meadow, PA-C  methocarbamol (ROBAXIN) 500 MG tablet Take 1 tablet (500 mg total) by mouth 4 (four) times daily. 12/08/20  Yes Fransico Meadow, PA-C  acetaminophen (TYLENOL) 500 MG tablet Take 500 mg by mouth every 6 (six) hours as needed for moderate pain.     [provider]  albuterol (PROVENTIL) (2.5  MG/3ML) 0.083% nebulizer solution Take 2.5 mg by nebulization every 6 (six) hours as needed for wheezing or shortness of breath.     [provider]  albuterol (VENTOLIN HFA) 108 (90 Base) MCG/ACT inhaler Inhale 2 puffs into the lungs every 6 (six) hours as needed for shortness of breath or wheezing. 05/24/20 05/24/21  [provider]  aspirin EC 81 MG tablet Take 81 mg by mouth daily.    [provider]  Carboxymethylcellul-Glycerin (LUBRICATING EYE DROPS OP) Place 1 drop into both eyes daily as needed (dry eyes).    [provider]  Cholecalciferol 25 MCG (1000 UT) capsule Take 1,000 Units by mouth daily. 04/20/20 04/20/21  [provider]  famotidine (PEPCID) 20 MG tablet Take 1 tablet (20 mg total) by mouth 2 (two) times daily as needed for heartburn or indigestion. Take minimum dose needed to control symptoms. Patient not taking: No sig reported 06/18/20   Mahala Menghini, PA-C  FEROSUL 325 [phone] Fe) MG tablet Take 325 mg by mouth every Monday, Wednesday, and Friday. 05/28/20   [provider]  fluticasone (FLONASE) 50 MCG/ACT nasal spray Place 2 sprays into both nostrils daily. 09/12/14   [provider]  gabapentin (NEURONTIN) 300 MG capsule Take 300 mg by mouth 2 (two) times daily.  03/30/15   [provider]  lactase (LACTAID) 3000 units tablet Take 3,000-9,000 Units by mouth daily as needed (consuming dairy).    [provider]  losartan (COZAAR) 25 MG tablet Take 25 mg by mouth daily.    [provider]  metFORMIN (GLUCOPHAGE-XR) 500 MG 24 hr tablet Take 1,000 mg by mouth in the morning and at bedtime.    [provider]  omeprazole (PRILOSEC) 40 MG capsule Take 40 mg by mouth daily.    [provider]  simvastatin (ZOCOR) 40 MG tablet Take 40 mg by mouth every evening.    [provider]  sitaGLIPtin (JANUVIA) 50 MG tablet Take 50 mg by mouth daily. 10/30/14   [provider]   SYMBICORT 80-4.5 MCG/ACT inhaler Inhale 2 puffs into the lungs 2 (two) times a day. 07/13/18   [provider]  tamsulosin (FLOMAX) 0.4 MG CAPS capsule Take 0.4 mg by mouth daily.    [provider]  vitamin B-12 (CYANOCOBALAMIN) 1000 MCG tablet Take 1,000 mcg by mouth daily. 02/22/20   [provider]    Allergies    Glipizide and Naproxen  Review of Systems   Review of Systems  Musculoskeletal:  Positive for neck pain.  Neurological:  Negative for headaches.  All other systems reviewed and are negative.  Physical Exam Updated Vital Signs BP 123/63 (BP Location: Right Arm)   Pulse 83   Temp 97.7 F (36.5 C)   Resp 18   Ht 5'  8" (1.727 m)   Wt 93.8 kg   SpO2 95%   BMI 31.44 kg/m   Physical Exam Vitals and nursing note reviewed.  Constitutional:      Appearance: He is well-developed.  HENT:     Head: Normocephalic and atraumatic.  Eyes:     Conjunctiva/sclera: Conjunctivae normal.  Cardiovascular:     Rate and Rhythm: Normal rate and regular rhythm.     Heart sounds: No murmur heard. Pulmonary:     Effort: Pulmonary effort is normal. No respiratory distress.     Breath sounds: Normal breath sounds.  Musculoskeletal:     Cervical back: Neck supple. Tenderness present.  Skin:    General: Skin is warm and dry.  Neurological:     Mental Status: He is alert.    ED Results / Procedures / Treatments   Labs (all labs ordered are listed, but only abnormal results are displayed) Labs Reviewed - No data to display  EKG None  Radiology DG Cervical Spine Complete  Result Date: 12/08/2020 CLINICAL DATA:  Posterior neck pain EXAM: CERVICAL SPINE - COMPLETE 4+ VIEW COMPARISON:  None. FINDINGS: No malalignment. Fusion at C3-C4. Partial fusion of vertebra as C6-C7. Vertebral body heights are maintained. Normal prevertebral soft tissue thickness. Mild degenerative changes at C4-C5 and C5-C6. Mild diffuse left-sided foraminal narrowing. Dens and  lateral masses are within normal limits. IMPRESSION: Fusion changes at C3-C4 with partially fused appearance of the C6 and C7 vertebra. There are mild degenerative changes elsewhere. No definite acute osseous abnormality. CT evaluation should be obtained if persistent concern for acute osseous injury Electronically Signed   By: Donavan Foil M.D.   On: 12/08/2020 20:00    Procedures Procedures   Medications Ordered in ED Medications  HYDROcodone-acetaminophen (NORCO/VICODIN) 5-325 MG per tablet 1 tablet (1 tablet Oral Not Given 12/08/20 2026)  methocarbamol (ROBAXIN) tablet 500 mg (500 mg Oral Not Given 12/08/20 2026)    ED Course  I have reviewed the triage vital signs and the nursing notes.  Pertinent labs & imaging results that were available during my care of the patient were reviewed by me and considered in my medical decision making (see chart for details).    MDM Rules/Calculators/A&P                           MDM:  Pt given rx for robaxin and hydrocodone.  Pt advised to call his MD on Monday for recheck,. Final Clinical Impression(s) / ED Diagnoses Final diagnoses:  Neck pain   An After Visit Summary was printed and given to the patient.  Rx / DC Orders ED Discharge Orders          Ordered    HYDROcodone-acetaminophen (NORCO/VICODIN) 5-325 MG tablet  Every 4 hours PRN        12/08/20 2017    methocarbamol (ROBAXIN) 500 MG tablet  4 times daily        12/08/20 2017             Sidney Ace 12/08/20 2301    Milton Ferguson, MD 12/10/20 [phone]

## 2020-12-08 NOTE — ED Notes (Signed)
Patient transported to X-ray 

## 2021-01-29 ENCOUNTER — Telehealth: Payer: Self-pay | Admitting: *Deleted

## 2021-01-29 NOTE — Telephone Encounter (Signed)
Release completed in Epic and records have been faxed

## 2021-01-29 NOTE — Telephone Encounter (Signed)
[facility] is requesting patient records --they have from 06/18/2020 but need anything since then --TCS--path faxed to [phone]  I will put in for Erline Levine and Lelon Frohlich since Manuela Schwartz is out unitl 02/27/2021

## 2021-04-12 IMAGING — CT CT ABD-PELV W/ CM
2 of 5 series · 16 of 46 positions shown, 18 images · IV contrast (Omnipaque or Isovue)
Comparison: 10/01/2015.

CLINICAL DATA: Right upper quadrant abdominal pain

EXAM:
CT ABDOMEN AND PELVIS WITH CONTRAST
TECHNIQUE: Multidetector CT imaging of the abdomen and pelvis was performed
using the standard protocol following bolus administration of
intravenous contrast.
CONTRAST:  100mL OMNIPAQUE IOHEXOL 300 MG/ML  SOLN

[Series 2: axial st · axial · 0.84mm/px · z∈[+843,+1283]mm · 13 of 100 slices shown, 15 images]
[im 6/100  soft-tissue]
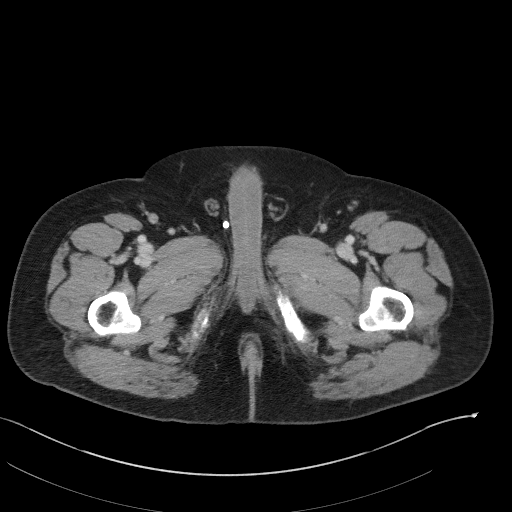
[im 6/100  bone]
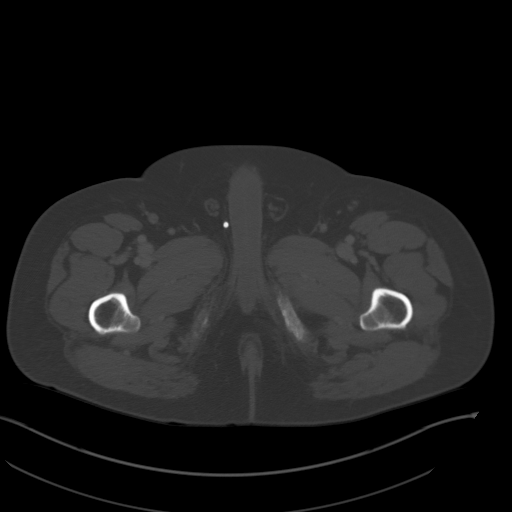
[im 12/100  soft-tissue]
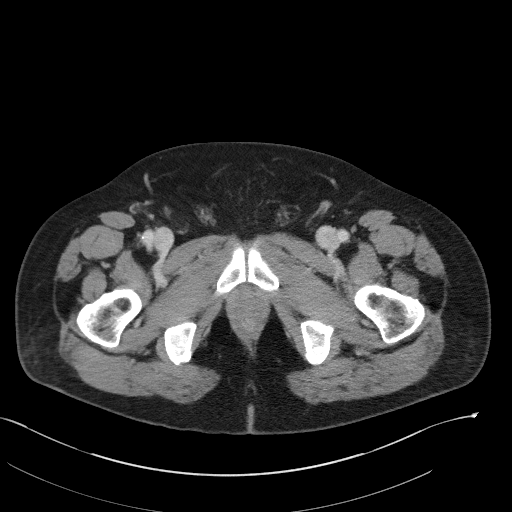
[im 23/100  soft-tissue]
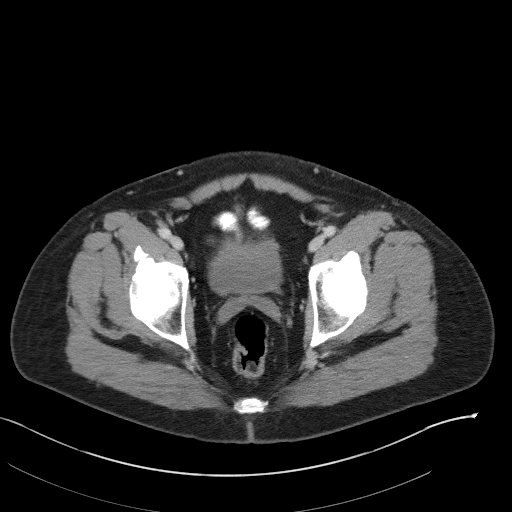
[im 28/100  soft-tissue]
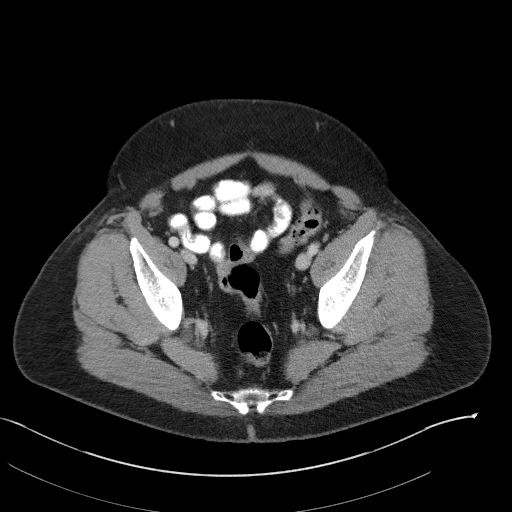
[im 34/100  soft-tissue]
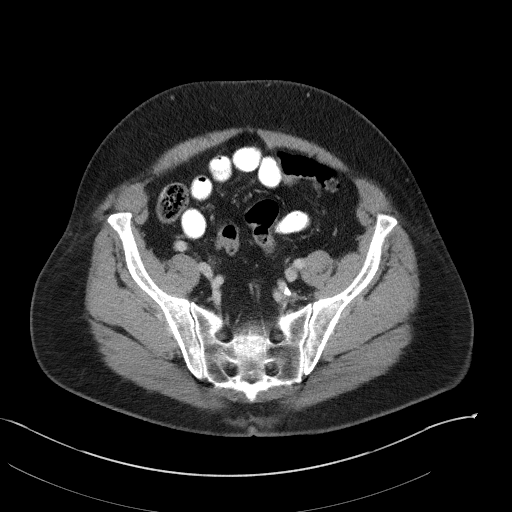
[im 45/100  soft-tissue]
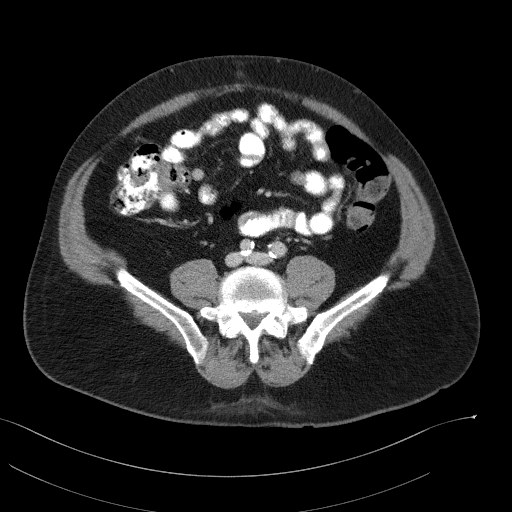
[im 50/100  soft-tissue]
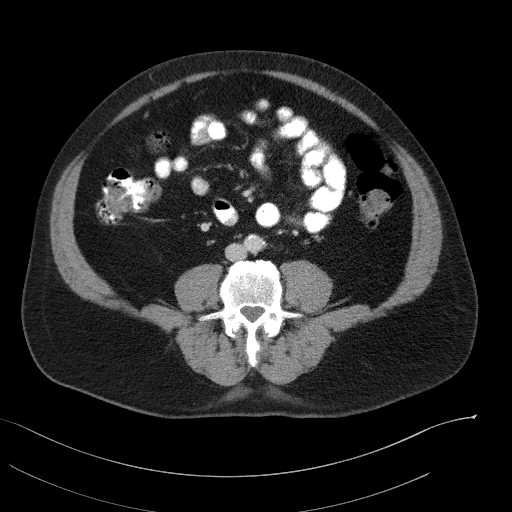
[im 56/100  soft-tissue]
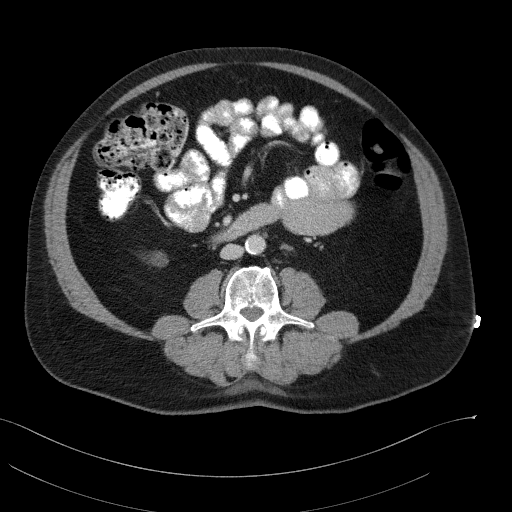
[im 67/100  soft-tissue]
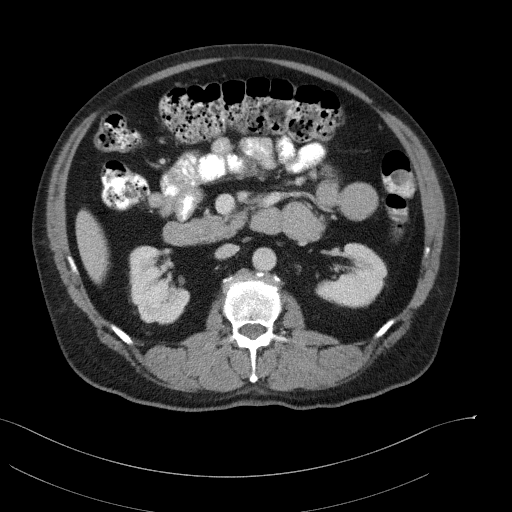
[im 67/100  bone]
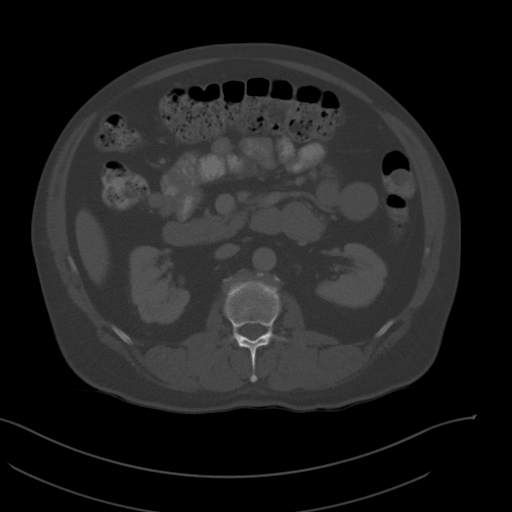
[im 72/100  soft-tissue]
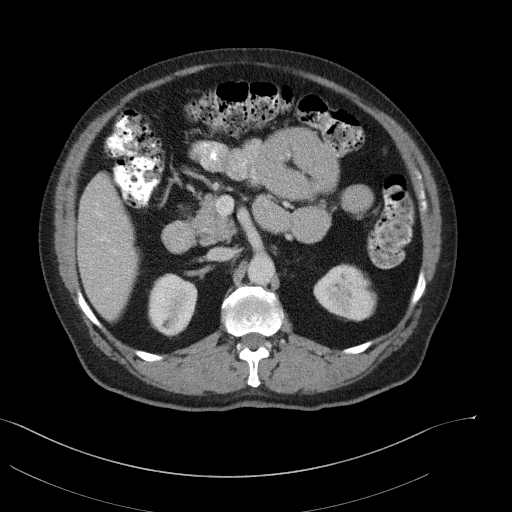
[im 78/100  soft-tissue]
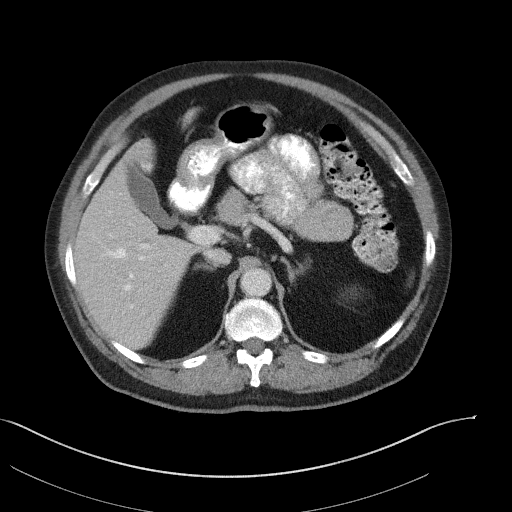
[im 89/100  soft-tissue]
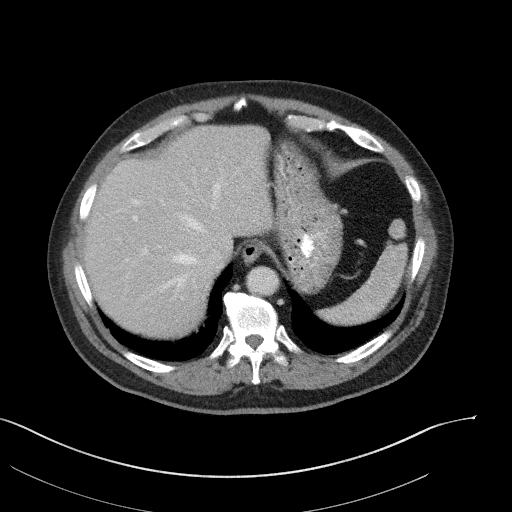
[im 94/100  soft-tissue]
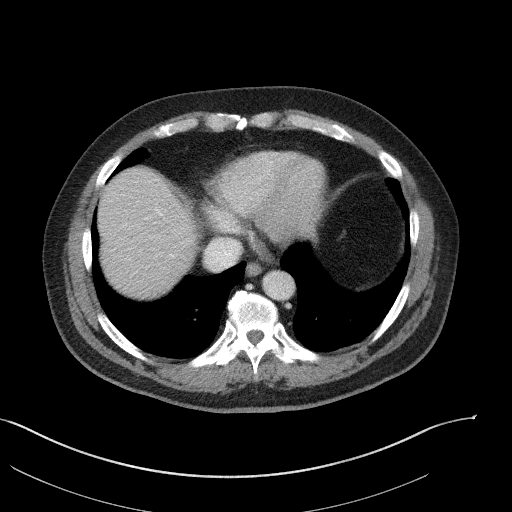

[Series 6: coronal st · coronal · 0.86mm/px · 3 of 117 slices shown]
[im 39/117  soft-tissue]
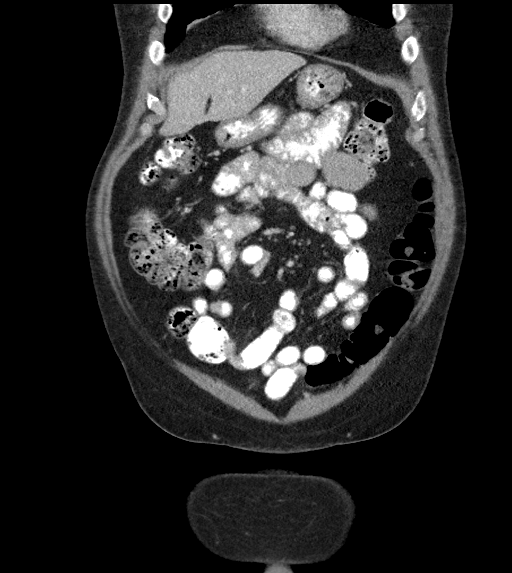
[im 52/117  soft-tissue]
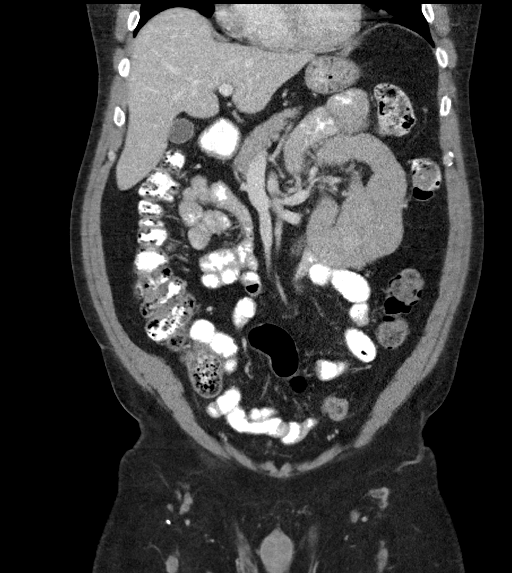
[im 65/117  soft-tissue]
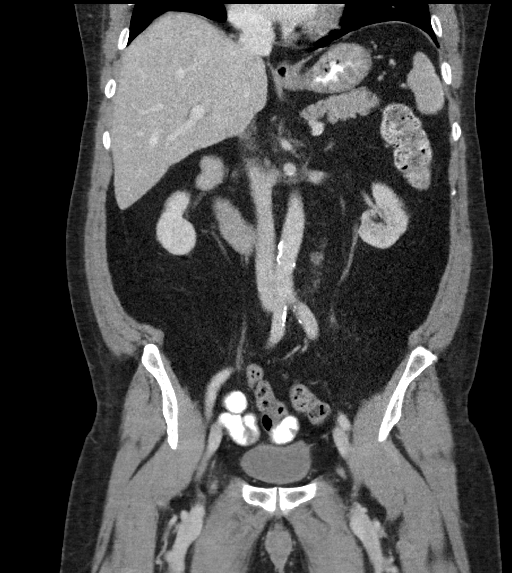

[16 of 46 positions shown; findings below may reference images not displayed]

FINDINGS: Lower chest: Lung bases are clear.

Hepatobiliary: Liver is within normal limits.

Gallbladder is unremarkable. No intrahepatic or extrahepatic ductal
dilatation.

Pancreas: Within normal limits.

Spleen: Within normal limits.

Adrenals/Urinary Tract: Adrenal glands are within normal limits.

Small bilateral renal cysts measuring up to 16 mm in the right lower
pole (series 2/image 43). Stable 3 mm nonobstructing interpolar
right renal calculus (series 2/image 33). Mild scarring in the
posterior right upper pole (series 2/image 33). No hydronephrosis.

Bladder is within normal limits.

Stomach/Bowel: Stomach is within normal limits.

No evidence of bowel obstruction.

Apparent soft tissue fullness/fold thickening involving the duodenal
bulb (series 2/images 26-27), which persists on delayed imaging
(series 3/image 12-13). However, note is made of a negative
endoscopy from June 2019. As such, this likely simply reflects a
prominent fold related to peristalsis.

Appendix is not discretely visualized, reportedly surgically absent.

Vascular/Lymphatic: No evidence of abdominal aortic aneurysm.

Atherosclerotic calcifications of the abdominal aorta and branch
vessels.

No suspicious abdominopelvic lymphadenopathy.

Reproductive: Prostate is unremarkable.

Other: No abdominopelvic ascites.

Musculoskeletal: Degenerative changes of the visualized
thoracolumbar spine.
IMPRESSION: 3 mm nonobstructing right renal calculus.  No hydronephrosis.

No evidence of bowel obstruction. Appendix is not discretely
visualized and is reportedly surgically absent.

Apparent soft tissue thickness/fold thickening involving the
duodenal bulb, likely reflecting a prominent fold related to
peristalsis in the setting of recent negative endoscopy. However, if
symptoms persist, consider GI consultation.

## 2021-05-22 ENCOUNTER — Other Ambulatory Visit: Payer: Self-pay | Admitting: Nephrology

## 2021-05-22 ENCOUNTER — Other Ambulatory Visit (HOSPITAL_COMMUNITY): Payer: Self-pay | Admitting: Nephrology

## 2021-05-22 DIAGNOSIS — D638 Anemia in other chronic diseases classified elsewhere: Secondary | ICD-10-CM

## 2021-05-22 DIAGNOSIS — N170 Acute kidney failure with tubular necrosis: Secondary | ICD-10-CM

## 2021-05-22 DIAGNOSIS — I129 Hypertensive chronic kidney disease with stage 1 through stage 4 chronic kidney disease, or unspecified chronic kidney disease: Secondary | ICD-10-CM

## 2021-05-22 DIAGNOSIS — E1122 Type 2 diabetes mellitus with diabetic chronic kidney disease: Secondary | ICD-10-CM

## 2021-05-31 ENCOUNTER — Ambulatory Visit (HOSPITAL_COMMUNITY)
Admission: RE | Admit: 2021-05-31 | Discharge: 2021-05-31 | Disposition: A | Discharge status: Home / Self Care | Payer: Medicare Other | Source: Ambulatory Visit | Attending: Nephrology | Admitting: Nephrology

## 2021-05-31 ENCOUNTER — Other Ambulatory Visit: Payer: Self-pay

## 2021-05-31 DIAGNOSIS — I129 Hypertensive chronic kidney disease with stage 1 through stage 4 chronic kidney disease, or unspecified chronic kidney disease: Secondary | ICD-10-CM | POA: Diagnosis present

## 2021-05-31 DIAGNOSIS — E1122 Type 2 diabetes mellitus with diabetic chronic kidney disease: Secondary | ICD-10-CM | POA: Insufficient documentation

## 2021-05-31 DIAGNOSIS — D638 Anemia in other chronic diseases classified elsewhere: Secondary | ICD-10-CM | POA: Insufficient documentation

## 2021-05-31 DIAGNOSIS — N170 Acute kidney failure with tubular necrosis: Secondary | ICD-10-CM | POA: Insufficient documentation

## 2021-08-31 IMAGING — US US RENAL
1 series · 14 of 25 positions shown · non-contrast
Comparison: Prior CT from 11/15/2019.

CLINICAL DATA: Initial evaluation for chronic kidney disease,
history of diabetes.

EXAM:
RENAL / URINARY TRACT ULTRASOUND COMPLETE

[Series 1: us renal · 14 of 45 slices shown]
[im 1/45]
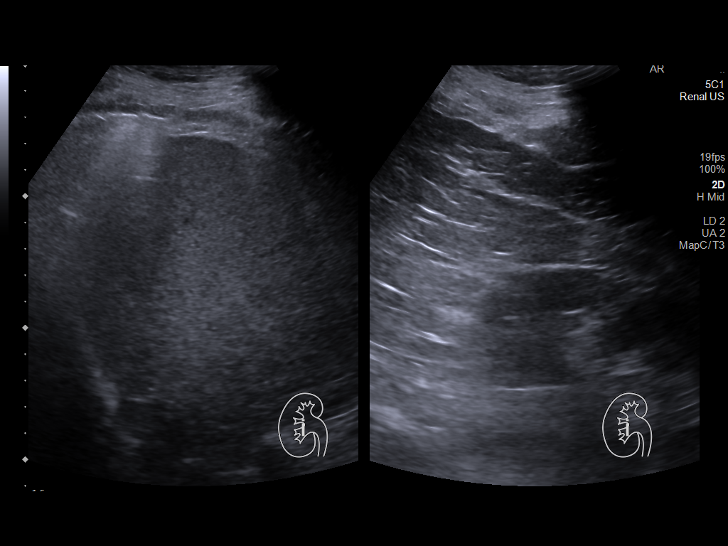
[im 4/45]
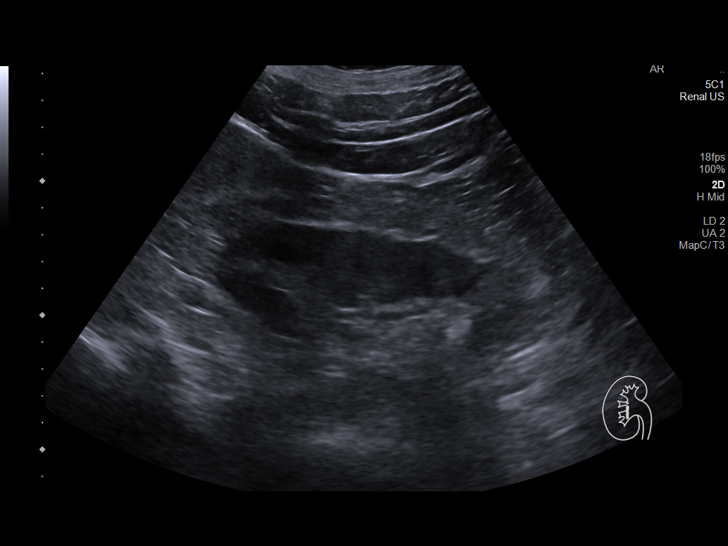
[im 8/45]
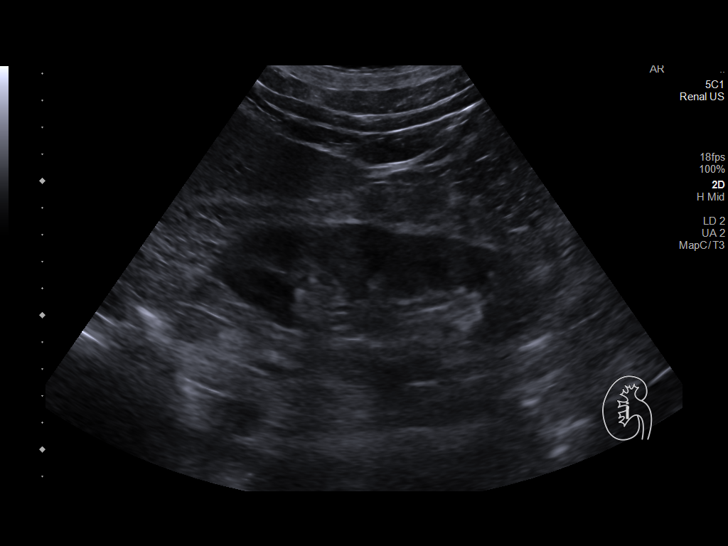
[im 12/45]
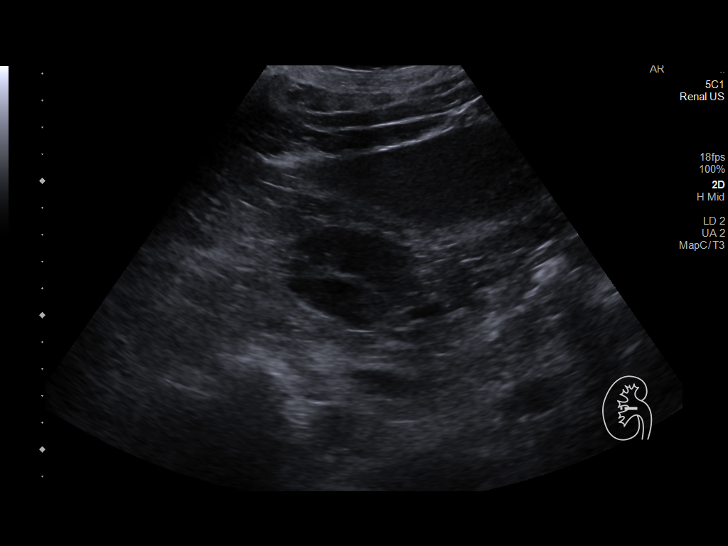
[im 15/45]
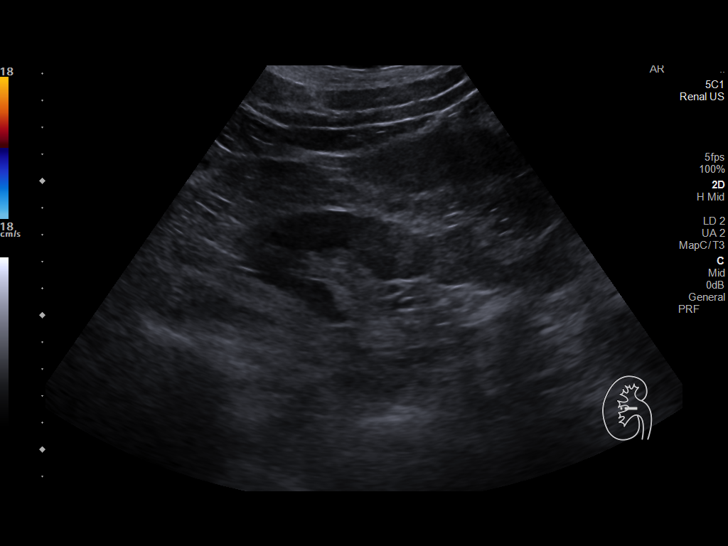
[im 17/45]
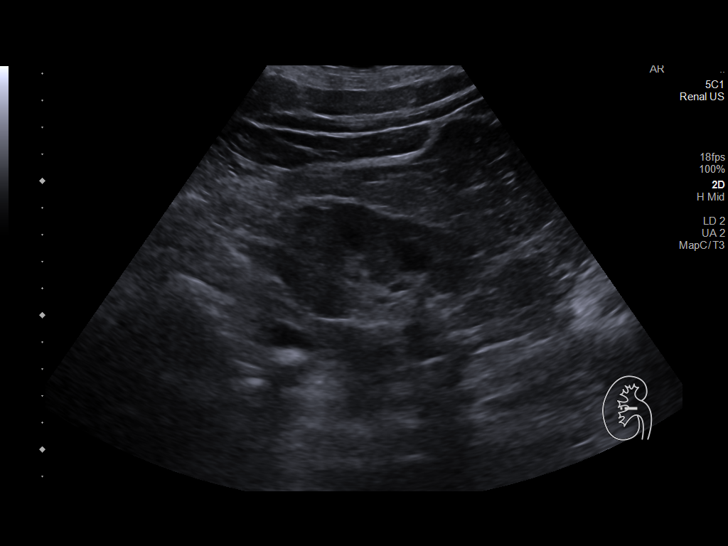
[im 21/45]
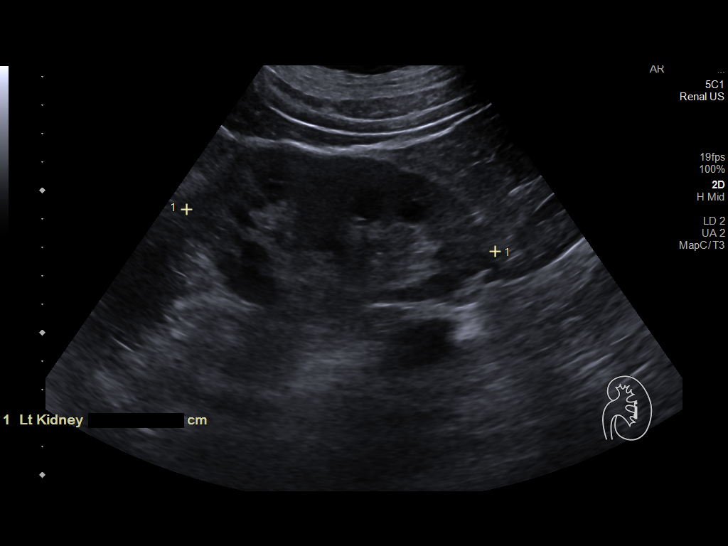
[im 24/45]
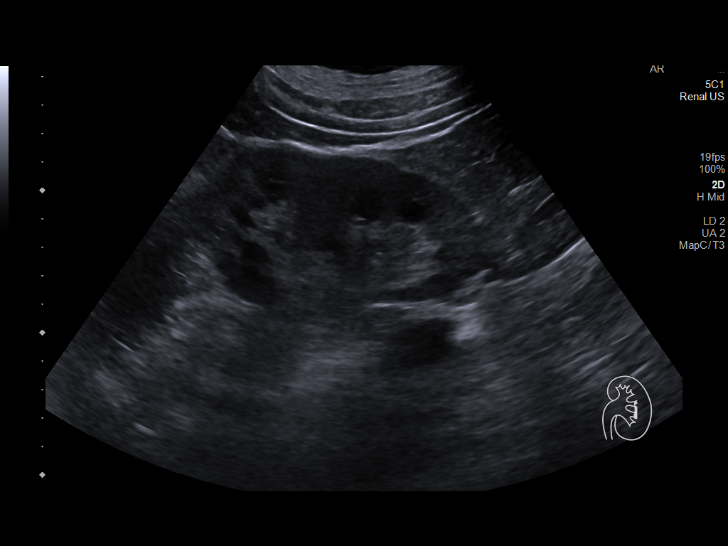
[im 28/45]
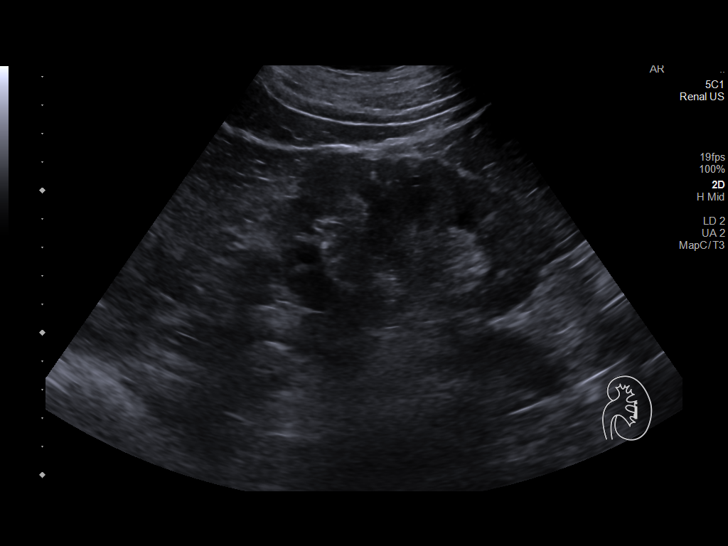
[im 30/45]
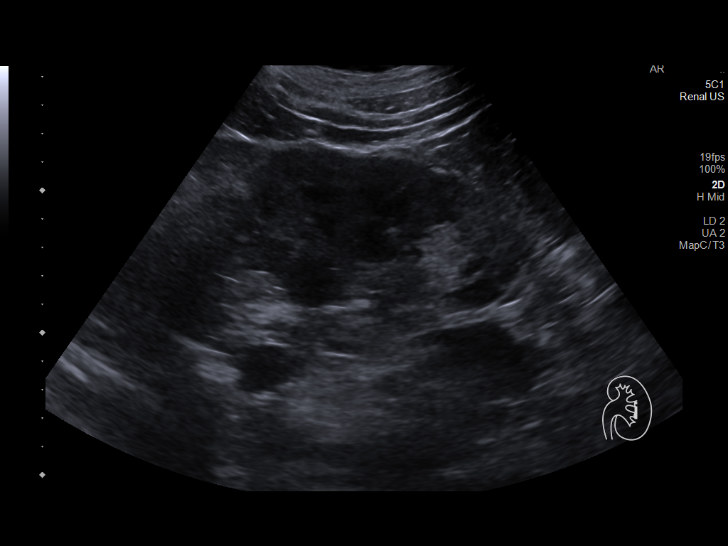
[im 34/45]
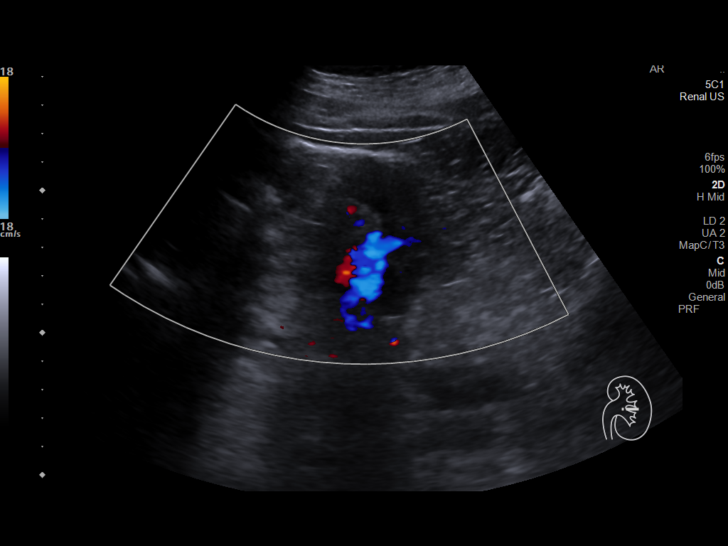
[im 37/45]
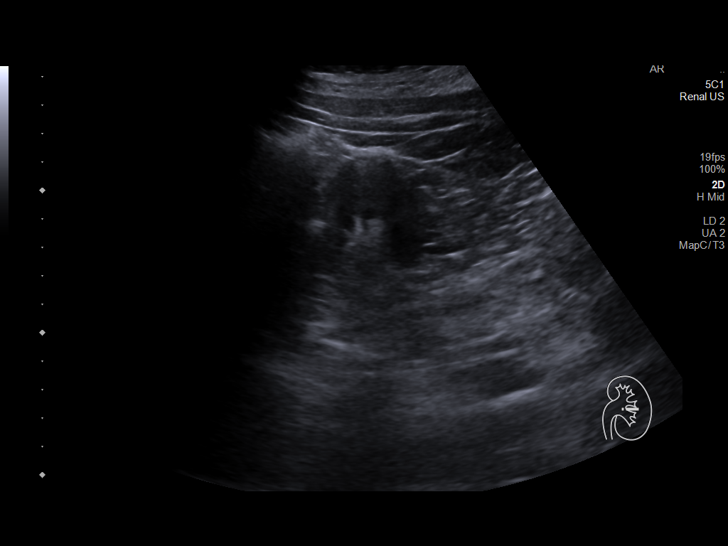
[im 41/45]
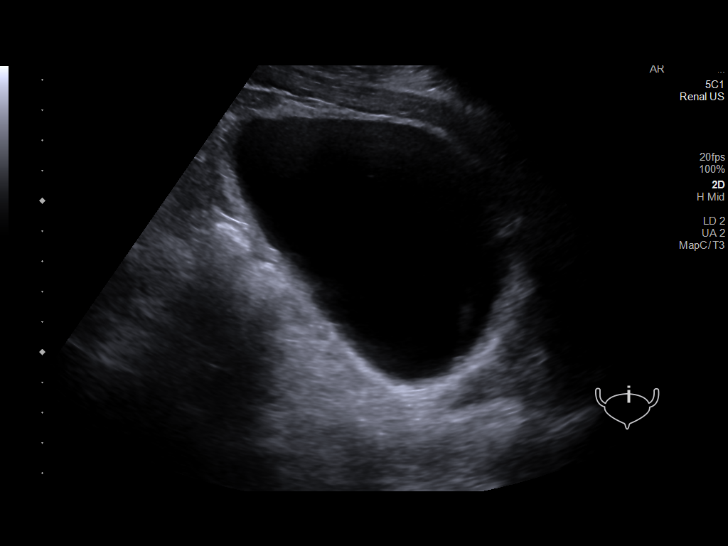
[im 45/45]
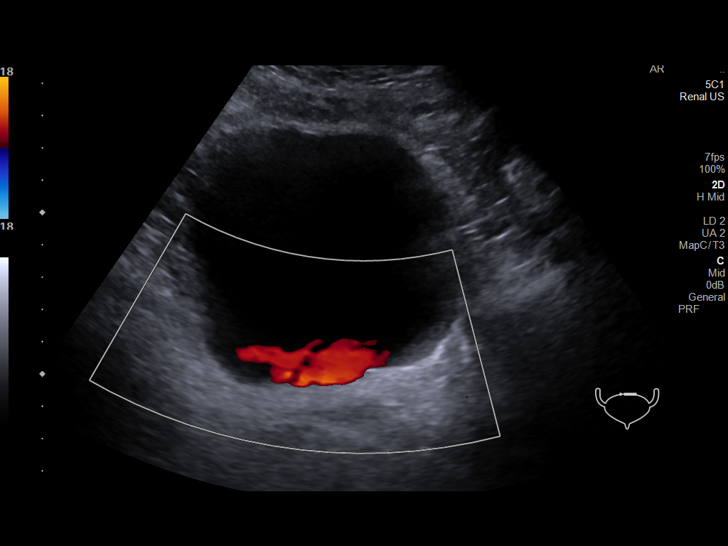

[14 of 25 positions shown; findings below may reference images not displayed]

FINDINGS: Right Kidney:

Renal measurements: 12.4 x 5.7 x 6.0 cm = volume: 222 mL. Renal
echogenicity within normal limits. No nephrolithiasis or
hydronephrosis. No focal renal mass.

Left Kidney:

Renal measurements: 11.3 x 6.1 x 4.9 cm = volume: 176.5 mL. Renal
echogenicity within normal limits. 6 mm nonobstructive calculus
present at the upper pole. No hydronephrosis. No focal renal mass.

Bladder:

Appears normal for degree of bladder distention. Bilateral ureteral
jets are visualized.

Other:

None.
IMPRESSION: 1. 6 mm nonobstructive calculus at the upper pole of the left
kidney.
2. Otherwise unremarkable and normal renal ultrasound. No
hydronephrosis.

## 2022-05-09 ENCOUNTER — Ambulatory Visit: Payer: 59 | Admitting: Podiatry

## 2022-05-23 ENCOUNTER — Ambulatory Visit (INDEPENDENT_AMBULATORY_CARE_PROVIDER_SITE_OTHER): Payer: 59 | Admitting: Podiatry

## 2022-05-23 DIAGNOSIS — M722 Plantar fascial fibromatosis: Secondary | ICD-10-CM | POA: Diagnosis not present

## 2022-05-23 NOTE — Progress Notes (Signed)
Subjective:  Patient ID: Samuel Bird, male    DOB: 1951/01/14,  MRN: [id number]  Chief Complaint  Patient presents with   Foot Pain    Heel pain     72 y.o. male presents with the above complaint.  [name] with left Planter fasciitis heel pain.  Patient states that painful to touch is progressive gotten worse hurts with ambulation hurts with pressure he would like to discuss treatment options for this.  He has not seen anyone as prior to seeing me.  He had injections in the past which seem to help.   Review of Systems: Negative except as noted in the HPI. Denies N/V/F/Ch.  Past Medical History:  Diagnosis Date   Arthritis    COPD (chronic obstructive pulmonary disease) ([place])    Diabetes mellitus ([facility])    GERD (gastroesophageal reflux disease)    Hyperlipidemia    Kidney stone     Current Outpatient Medications:    acetaminophen (TYLENOL) 500 MG tablet, Take 500 mg by mouth every 6 (six) hours as needed for moderate pain. , Disp: , Rfl:    albuterol (PROVENTIL) (2.5 MG/3ML) 0.083% nebulizer solution, Take 2.5 mg by nebulization every 6 (six) hours as needed for wheezing or shortness of breath. , Disp: , Rfl:    albuterol (VENTOLIN HFA) 108 (90 Base) MCG/ACT inhaler, Inhale 2 puffs into the lungs every 6 (six) hours as needed for shortness of breath or wheezing., Disp: , Rfl:    aspirin EC 81 MG tablet, Take 81 mg by mouth daily., Disp: , Rfl:    Carboxymethylcellul-Glycerin (LUBRICATING EYE DROPS OP), Place 1 drop into both eyes daily as needed (dry eyes)., Disp: , Rfl:    famotidine (PEPCID) 20 MG tablet, Take 1 tablet (20 mg total) by mouth 2 (two) times daily as needed for heartburn or indigestion. Take minimum dose needed to control symptoms. (Patient not taking: No sig reported), Disp: 90 tablet, Rfl: 3   FEROSUL 325 (65 Fe) MG tablet, Take 325 mg by mouth every Monday, Wednesday, and Friday., Disp: , Rfl:    fluticasone (FLONASE) 50 MCG/ACT nasal spray, Place 2 sprays into  both nostrils daily., Disp: , Rfl:    gabapentin (NEURONTIN) 300 MG capsule, Take 300 mg by mouth 2 (two) times daily. , Disp: , Rfl:    lactase (LACTAID) 3000 units tablet, Take 3,000-9,000 Units by mouth daily as needed (consuming dairy)., Disp: , Rfl:    losartan (COZAAR) 25 MG tablet, Take 25 mg by mouth daily., Disp: , Rfl:    metFORMIN (GLUCOPHAGE-XR) 500 MG 24 hr tablet, Take 1,000 mg by mouth in the morning and at bedtime., Disp: , Rfl:    methocarbamol (ROBAXIN) 500 MG tablet, Take 1 tablet (500 mg total) by mouth 4 (four) times daily., Disp: 20 tablet, Rfl: 0   omeprazole (PRILOSEC) 40 MG capsule, Take 40 mg by mouth daily., Disp: , Rfl:    simvastatin (ZOCOR) 40 MG tablet, Take 40 mg by mouth every evening., Disp: , Rfl:    sitaGLIPtin (JANUVIA) 50 MG tablet, Take 50 mg by mouth daily., Disp: , Rfl:    SYMBICORT 80-4.5 MCG/ACT inhaler, Inhale 2 puffs into the lungs 2 (two) times a day., Disp: , Rfl:    tamsulosin (FLOMAX) 0.4 MG CAPS capsule, Take 0.4 mg by mouth daily., Disp: , Rfl:    vitamin B-12 (CYANOCOBALAMIN) 1000 MCG tablet, Take 1,000 mcg by mouth daily., Disp: , Rfl:   Social History   Tobacco Use  Smoking Status Former   Packs/day: 0.50   Years: 43.00   Total pack years: 21.50   Types: Cigarettes   Quit date: 05/30/2008   Years since quitting: 13.9  Smokeless Tobacco Never    Allergies  Allergen Reactions   Glipizide Nausea And Vomiting   Naproxen Other (See Comments)    "messing with his kidneys"   Objective:  There were no vitals filed for this visit. There is no height or weight on file to calculate BMI. Constitutional Well developed. Well nourished.  Vascular Dorsalis pedis pulses palpable bilaterally. Posterior tibial pulses palpable bilaterally. Capillary refill normal to all digits.  No cyanosis or clubbing noted. Pedal hair growth normal.  Neurologic Normal speech. Oriented to person, place, and time. Epicritic sensation to light touch grossly  present bilaterally.  Dermatologic Nails well groomed and normal in appearance. No open wounds. No skin lesions.  Orthopedic: Normal joint ROM without pain or crepitus bilaterally. No visible deformities. Tender to palpation at the calcaneal tuber left. No pain with calcaneal squeeze left. Ankle ROM diminished range of motion left. Silfverskiold Test: positive left.   Radiographs: None  Assessment:   1. Plantar fasciitis of left foot    Plan:  Patient was evaluated and treated and all questions answered.  Plantar Fasciitis, left - XR reviewed as above.  - Educated on icing and stretching. Instructions given.  - Injection delivered to the plantar fascia as below. - DME: Plantar fascial brace dispensed to support the medial longitudinal arch of the foot and offload pressure from the heel and prevent arch collapse during weightbearing - Pharmacologic management: None  Procedure: Injection Tendon/Ligament Location: Left plantar fascia at the glabrous junction; medial approach. Skin Prep: alcohol Injectate: 0.5 cc 0.5% marcaine plain, 0.5 cc of 1% Lidocaine, 0.5 cc kenalog 10. Disposition: Patient tolerated procedure well. Injection site dressed with a band-aid.  No follow-ups on file.

## 2022-07-01 ENCOUNTER — Encounter: Payer: 59 | Admitting: Podiatry

## 2022-07-04 ENCOUNTER — Ambulatory Visit (INDEPENDENT_AMBULATORY_CARE_PROVIDER_SITE_OTHER): Payer: 59 | Admitting: Podiatry

## 2022-07-04 DIAGNOSIS — M62462 Contracture of muscle, left lower leg: Secondary | ICD-10-CM

## 2022-07-04 DIAGNOSIS — M722 Plantar fascial fibromatosis: Secondary | ICD-10-CM

## 2022-07-04 NOTE — Progress Notes (Unsigned)
Subjective:  Patient ID: Samuel Bird, male    DOB: 01-18-1951,  MRN: [id number]  Chief Complaint  Patient presents with   Plantar Fasciitis    72 y.o. male presents with the above complaint.  Patient presents for follow-up of Planter fasciitis.  He states is doing better the injection helped.  He would like to discuss next treatment plan he still has some residual pain pain scale is now 5 out of 10.   Review of Systems: Negative except as noted in the HPI. Denies N/V/F/Ch.  Past Medical History:  Diagnosis Date   Arthritis    COPD (chronic obstructive pulmonary disease) (HCC)    Diabetes mellitus (HCC)    GERD (gastroesophageal reflux disease)    Hyperlipidemia    Kidney stone     Current Outpatient Medications:    acetaminophen (TYLENOL) 500 MG tablet, Take 500 mg by mouth every 6 (six) hours as needed for moderate pain. , Disp: , Rfl:    albuterol (PROVENTIL) (2.5 MG/3ML) 0.083% nebulizer solution, Take 2.5 mg by nebulization every 6 (six) hours as needed for wheezing or shortness of breath. , Disp: , Rfl:    albuterol (VENTOLIN HFA) 108 (90 Base) MCG/ACT inhaler, Inhale 2 puffs into the lungs every 6 (six) hours as needed for shortness of breath or wheezing., Disp: , Rfl:    aspirin EC 81 MG tablet, Take 81 mg by mouth daily., Disp: , Rfl:    Carboxymethylcellul-Glycerin (LUBRICATING EYE DROPS OP), Place 1 drop into both eyes daily as needed (dry eyes)., Disp: , Rfl:    famotidine (PEPCID) 20 MG tablet, Take 1 tablet (20 mg total) by mouth 2 (two) times daily as needed for heartburn or indigestion. Take minimum dose needed to control symptoms. (Patient not taking: No sig reported), Disp: 90 tablet, Rfl: 3   FEROSUL 325 (65 Fe) MG tablet, Take 325 mg by mouth every Monday, Wednesday, and Friday., Disp: , Rfl:    fluticasone (FLONASE) 50 MCG/ACT nasal spray, Place 2 sprays into both nostrils daily., Disp: , Rfl:    gabapentin (NEURONTIN) 300 MG capsule, Take 300 mg by mouth 2 (two)  times daily. , Disp: , Rfl:    lactase (LACTAID) 3000 units tablet, Take 3,000-9,000 Units by mouth daily as needed (consuming dairy)., Disp: , Rfl:    losartan (COZAAR) 25 MG tablet, Take 25 mg by mouth daily., Disp: , Rfl:    metFORMIN (GLUCOPHAGE-XR) 500 MG 24 hr tablet, Take 1,000 mg by mouth in the morning and at bedtime., Disp: , Rfl:    methocarbamol (ROBAXIN) 500 MG tablet, Take 1 tablet (500 mg total) by mouth 4 (four) times daily., Disp: 20 tablet, Rfl: 0   omeprazole (PRILOSEC) 40 MG capsule, Take 40 mg by mouth daily., Disp: , Rfl:    simvastatin (ZOCOR) 40 MG tablet, Take 40 mg by mouth every evening., Disp: , Rfl:    sitaGLIPtin (JANUVIA) 50 MG tablet, Take 50 mg by mouth daily., Disp: , Rfl:    SYMBICORT 80-4.5 MCG/ACT inhaler, Inhale 2 puffs into the lungs 2 (two) times a day., Disp: , Rfl:    tamsulosin (FLOMAX) 0.4 MG CAPS capsule, Take 0.4 mg by mouth daily., Disp: , Rfl:    vitamin B-12 (CYANOCOBALAMIN) 1000 MCG tablet, Take 1,000 mcg by mouth daily., Disp: , Rfl:   Social History   Tobacco Use  Smoking Status Former   Packs/day: 0.50   Years: 43.00   Additional pack years: 0.00   Total pack years: 21.50  Types: Cigarettes   Quit date: 05/30/2008   Years since quitting: 14.1  Smokeless Tobacco Never    Allergies  Allergen Reactions   Glipizide Nausea And Vomiting   Naproxen Other (See Comments)    "messing with his kidneys"   Objective:  There were no vitals filed for this visit. There is no height or weight on file to calculate BMI. Constitutional Well developed. Well nourished.  Vascular Dorsalis pedis pulses palpable bilaterally. Posterior tibial pulses palpable bilaterally. Capillary refill normal to all digits.  No cyanosis or clubbing noted. Pedal hair growth normal.  Neurologic Normal speech. Oriented to person, place, and time. Epicritic sensation to light touch grossly present bilaterally.  Dermatologic Nails well groomed and normal in  appearance. No open wounds. No skin lesions.  Orthopedic: Normal joint ROM without pain or crepitus bilaterally. No visible deformities. Tender to palpation at the calcaneal tuber left. No pain with calcaneal squeeze left. Ankle ROM diminished range of motion left. Silfverskiold Test: positive left.   Radiographs: None  Assessment:   1. Plantar fasciitis of left foot   2. Gastrocnemius equinus, left     Plan:  Patient was evaluated and treated and all questions answered.  Plantar Fasciitis, left with underlying gastrocnemius equinus - XR reviewed as above.  - Educated on icing and stretching. Instructions given.  -Second injection delivered to the plantar fascia as below. - DME: Plantar fascial brace dispensed to support the medial longitudinal arch of the foot and offload pressure from the heel and prevent arch collapse during weightbearing - Pharmacologic management: None  Procedure: Injection Tendon/Ligament Location: Left plantar fascia at the glabrous junction; medial approach. Skin Prep: alcohol Injectate: 0.5 cc 0.5% marcaine plain, 0.5 cc of 1% Lidocaine, 0.5 cc kenalog 10. Disposition: Patient tolerated procedure well. Injection site dressed with a band-aid.  No follow-ups on file.

## 2022-10-27 IMAGING — US US RENAL
1 series · 13 of 25 positions shown · non-contrast
Comparison: Renal ultrasound 04/04/2020.  CT 11/15/2019.

CLINICAL DATA: Acute kidney failure.

EXAM:
RENAL / URINARY TRACT ULTRASOUND COMPLETE

[Series 1: us renal · 13 of 53 slices shown]
[im 1/53]
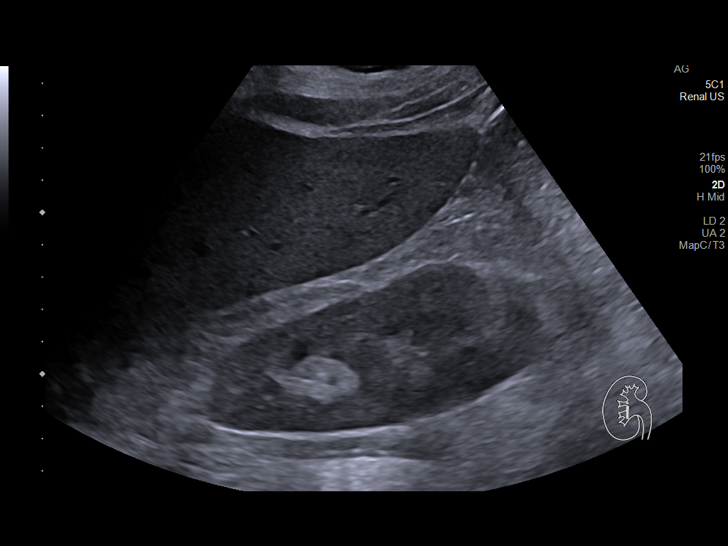
[im 5/53]
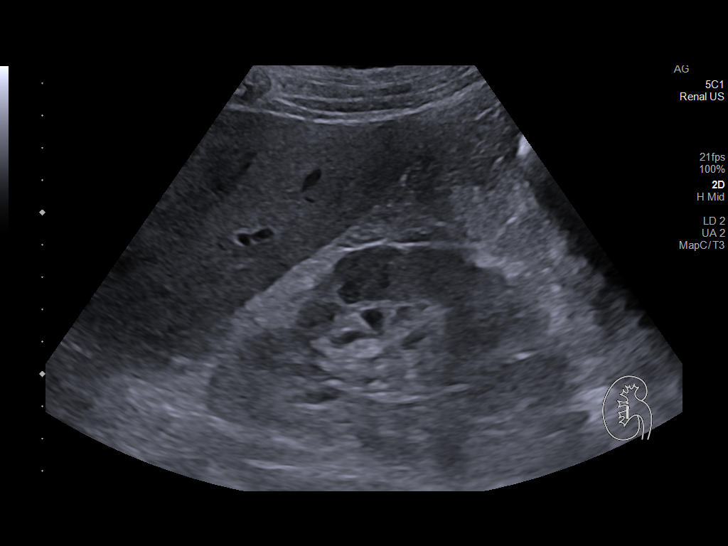
[im 9/53]
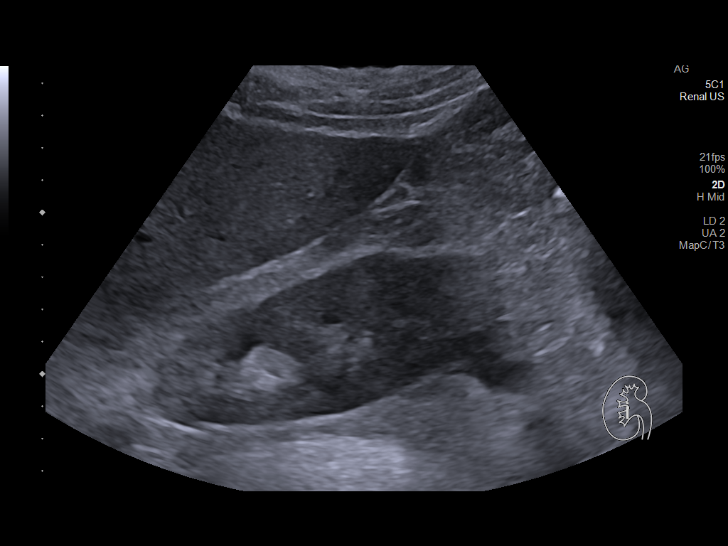
[im 14/53]
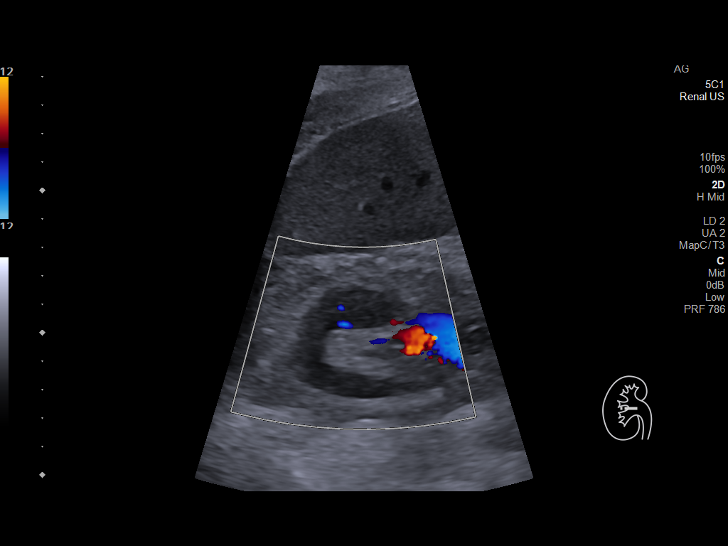
[im 18/53]
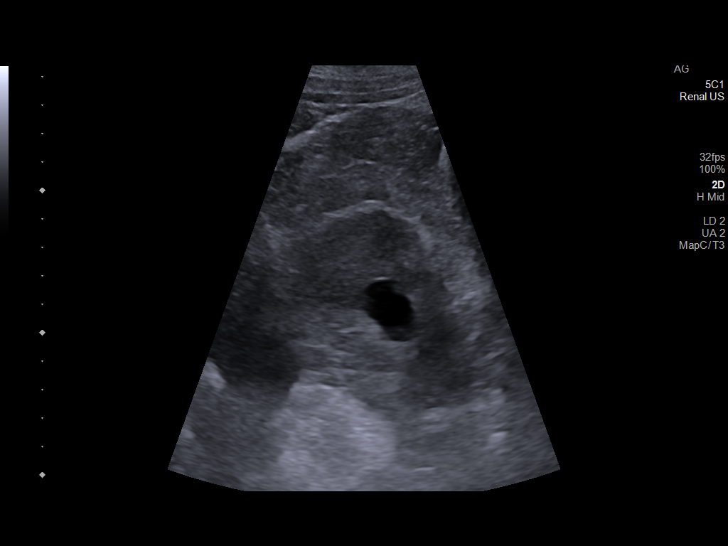
[im 22/53]
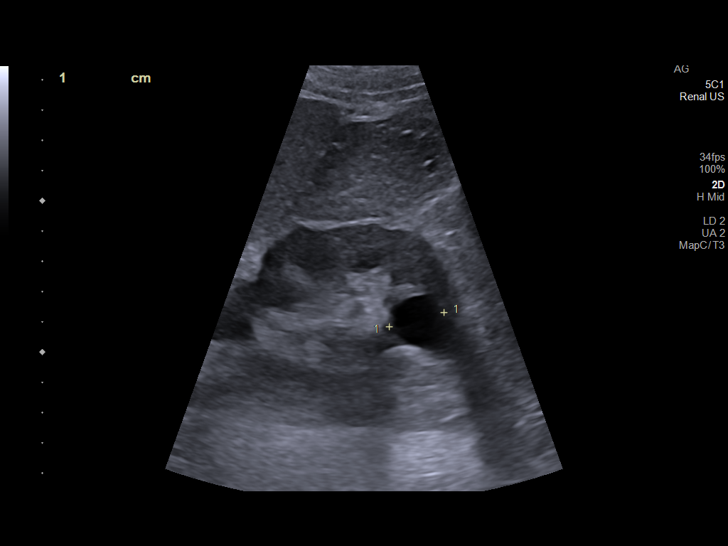
[im 27/53]
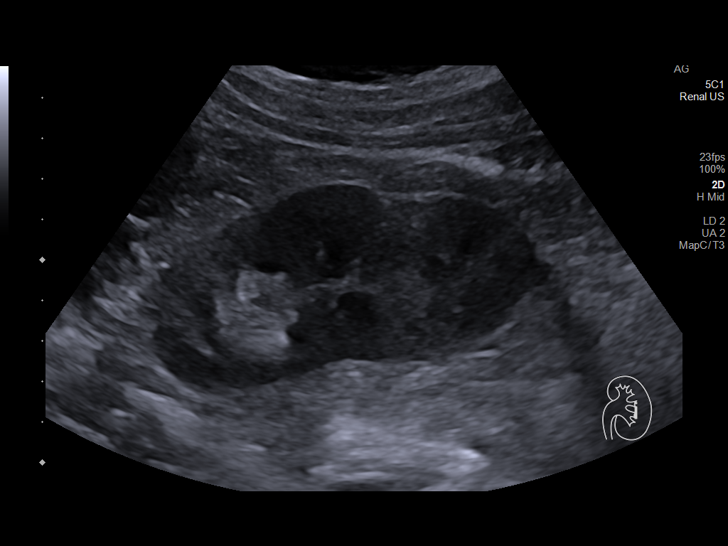
[im 31/53]
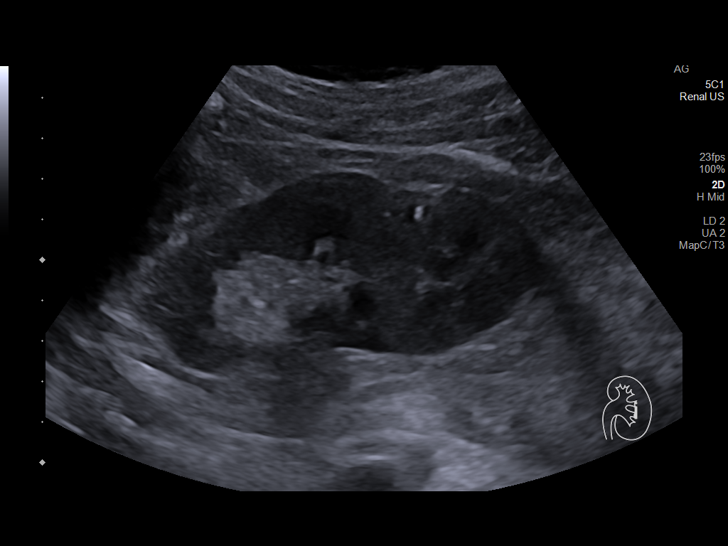
[im 35/53]
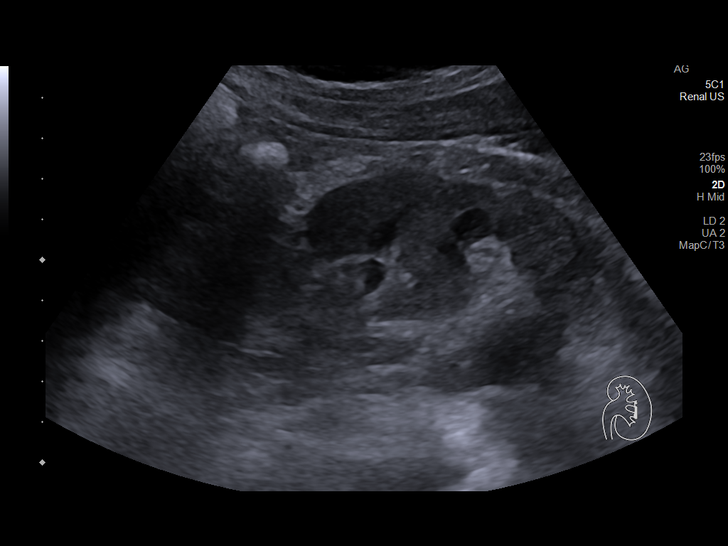
[im 40/53]
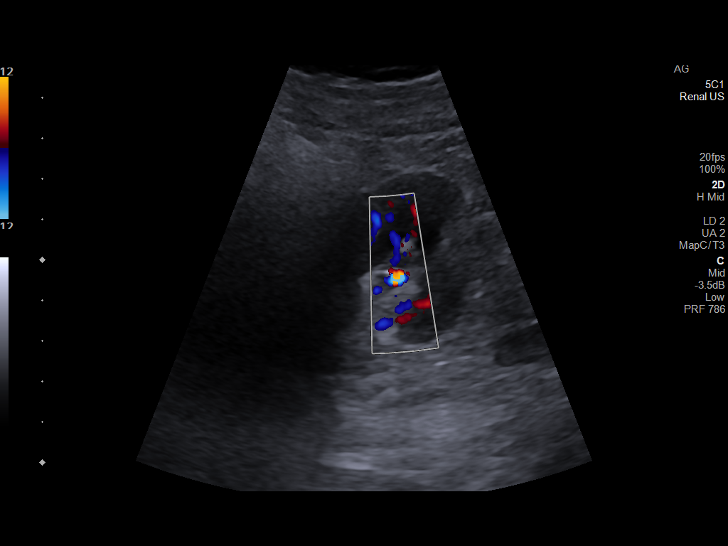
[im 44/53]
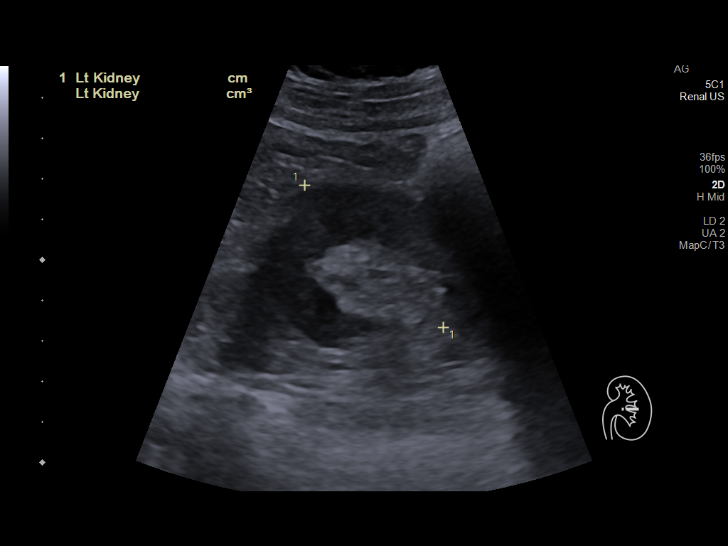
[im 48/53]
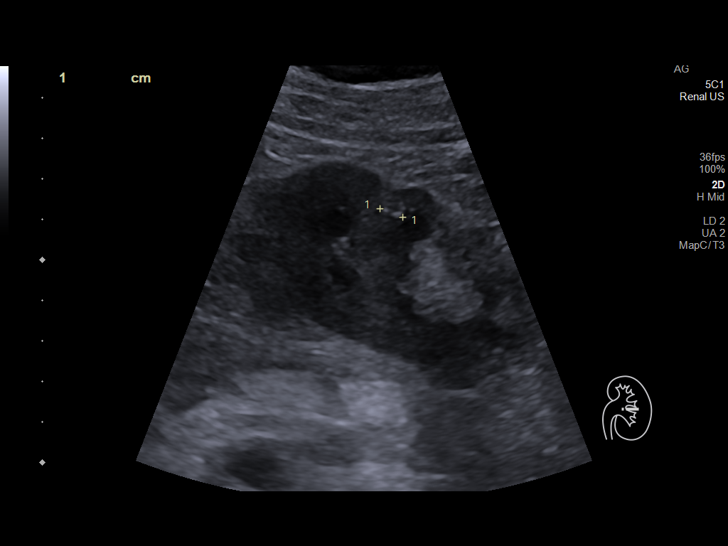
[im 53/53]
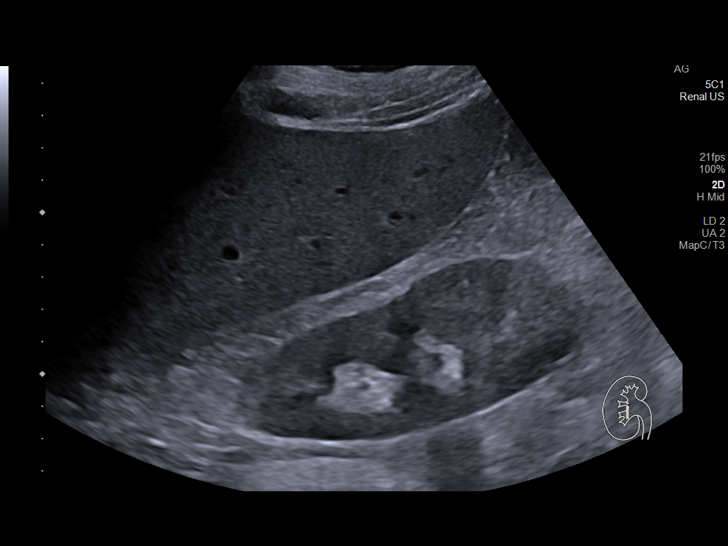

[13 of 25 positions shown; findings below may reference images not displayed]

FINDINGS: Right Kidney:

Renal measurements: 12.0 x 4.6 x 5.0 cm = volume: 143.9 mL. Mild
increased echogenicity. Cortical defect consistent with scarring.
2.3 cm simple cyst lower pole right kidney again noted. Renal blood
flow noted. No hydronephrosis.

Left Kidney:

Renal measurements: 10.6 x 5.1 x 4.9 cm = volume: 137.2 mL. Mild
increased echogenicity cannot be excluded. Cortical irregularity
consistent with scarring. Tiny nonobstructing renal calyceal stones,
the largest measuring 6 mm. Renal blood flow noted. No
hydronephrosis.

Bladder:

Appears normal for degree of bladder distention. Bilateral ureteral
jets noted.

Other:

None.
IMPRESSION: 1. Mild right renal increased echogenicity. Mild left renal
increased echogenicity cannot be excluded. Findings suggest chronic
medical renal disease. Changes of cortical scarring noted
bilaterally.

2. 2.3 cm simple cyst lower pole right kidney again noted. Tiny
nonobstructive left renal calyceal stones, the largest measuring 6
mm, noted.

3. No acute abnormalities. No hydronephrosis or bladder distention.
